# Patient Record
Sex: Female | Born: 1935 | Race: White | Hispanic: No
Health system: Southern US, Community
[De-identification: ages and names within clinical notes are randomized; demographics above are authoritative.]

---

## 2009-02-13 ENCOUNTER — Ambulatory Visit: Payer: Self-pay | Admitting: Anesthesiology

## 2009-02-21 ENCOUNTER — Ambulatory Visit: Payer: Self-pay | Admitting: Anesthesiology

## 2009-04-19 ENCOUNTER — Ambulatory Visit: Payer: Self-pay | Admitting: Anesthesiology

## 2009-05-10 ENCOUNTER — Ambulatory Visit: Payer: Self-pay | Admitting: Anesthesiology

## 2009-06-28 ENCOUNTER — Ambulatory Visit: Payer: Self-pay | Admitting: Anesthesiology

## 2009-07-19 ENCOUNTER — Ambulatory Visit: Payer: Self-pay | Admitting: Anesthesiology

## 2009-08-18 ENCOUNTER — Ambulatory Visit: Payer: Self-pay | Admitting: Anesthesiology

## 2012-02-29 ENCOUNTER — Inpatient Hospital Stay: Payer: Self-pay | Admitting: Specialist

## 2012-02-29 LAB — URINALYSIS, COMPLETE
Bilirubin,UR: NEGATIVE
Glucose,UR: NEGATIVE mg/dL (ref 0–75)
Leukocyte Esterase: NEGATIVE
Nitrite: POSITIVE
Protein: NEGATIVE
RBC,UR: 1 /HPF (ref 0–5)
WBC UR: 3 /HPF (ref 0–5)

## 2012-02-29 LAB — COMPREHENSIVE METABOLIC PANEL
Albumin: 3.4 g/dL (ref 3.4–5.0)
Anion Gap: 12 (ref 7–16)
BUN: 17 mg/dL (ref 7–18)
Calcium, Total: 8.7 mg/dL (ref 8.5–10.1)
Chloride: 105 mmol/L (ref 98–107)
EGFR (African American): >60
EGFR (Non-African Amer.): >60
Glucose: 171 mg/dL — ABNORMAL HIGH (ref 65–99)
Potassium: 3.8 mmol/L (ref 3.5–5.1)
SGPT (ALT): 19 U/L
Sodium: 140 mmol/L (ref 136–145)

## 2012-02-29 LAB — CBC
HCT: 42.3 % (ref 35.0–47.0)
HGB: 14 g/dL (ref 12.0–16.0)
MCHC: 33.2 g/dL (ref 32.0–36.0)
MCV: 98 fL (ref 80–100)
Platelet: 237 10*3/uL (ref 150–440)
RBC: 4.32 10*6/uL (ref 3.80–5.20)
RDW: 13 % (ref 11.5–14.5)

## 2012-02-29 LAB — PROTIME-INR
INR: 1.2
Prothrombin Time: 15.4 secs — ABNORMAL HIGH (ref 11.5–14.7)

## 2012-02-29 LAB — CK TOTAL AND CKMB (NOT AT ARMC): CK-MB: 8.1 ng/mL — ABNORMAL HIGH (ref 0.5–3.6)

## 2012-02-29 LAB — TROPONIN I: Troponin-I: <0.02 ng/mL

## 2012-03-01 LAB — CBC WITH DIFFERENTIAL/PLATELET
Basophil %: 0.2 %
Eosinophil %: 0 %
HCT: 37.5 % (ref 35.0–47.0)
HGB: 12.6 g/dL (ref 12.0–16.0)
Lymphocyte %: 7.5 %
MCV: 98 fL (ref 80–100)
Monocyte #: 1.5 x10 3/mm — ABNORMAL HIGH (ref 0.2–0.9)
Monocyte %: 10 %
Neutrophil #: 12 10*3/uL — ABNORMAL HIGH (ref 1.4–6.5)
Neutrophil %: 82.3 %
Platelet: 201 10*3/uL (ref 150–440)
RDW: 13.5 % (ref 11.5–14.5)

## 2012-03-01 LAB — BASIC METABOLIC PANEL
Anion Gap: 9 (ref 7–16)
BUN: 16 mg/dL (ref 7–18)
Calcium, Total: 8.3 mg/dL — ABNORMAL LOW (ref 8.5–10.1)
Chloride: 107 mmol/L (ref 98–107)
Creatinine: 0.59 mg/dL — ABNORMAL LOW (ref 0.60–1.30)
EGFR (Non-African Amer.): >60
Glucose: 136 mg/dL — ABNORMAL HIGH (ref 65–99)
Sodium: 144 mmol/L (ref 136–145)

## 2012-03-01 LAB — CK TOTAL AND CKMB (NOT AT ARMC)
CK, Total: 848 U/L — ABNORMAL HIGH (ref 21–215)
CK-MB: 10.5 ng/mL — ABNORMAL HIGH (ref 0.5–3.6)

## 2012-03-01 LAB — TROPONIN I: Troponin-I: <0.02 ng/mL

## 2012-03-02 LAB — BASIC METABOLIC PANEL
Anion Gap: 7 (ref 7–16)
BUN: 11 mg/dL (ref 7–18)
EGFR (African American): >60
Glucose: 92 mg/dL (ref 65–99)
Osmolality: 278 (ref 275–301)

## 2012-03-02 LAB — CBC WITH DIFFERENTIAL/PLATELET
Basophil #: 0 10*3/uL (ref 0.0–0.1)
Basophil %: 0.2 %
Eosinophil #: 0.1 10*3/uL (ref 0.0–0.7)
Eosinophil %: 0.8 %
HCT: 30.7 % — ABNORMAL LOW (ref 35.0–47.0)
HGB: 10.3 g/dL — ABNORMAL LOW (ref 12.0–16.0)
Lymphocyte %: 10.9 %
MCH: 33.1 pg (ref 26.0–34.0)
MCV: 98 fL (ref 80–100)
Monocyte #: 1.4 x10 3/mm — ABNORMAL HIGH (ref 0.2–0.9)
Monocyte %: 11.9 %
RBC: 3.13 10*6/uL — ABNORMAL LOW (ref 3.80–5.20)
RDW: 13.3 % (ref 11.5–14.5)

## 2012-03-03 LAB — CBC WITH DIFFERENTIAL/PLATELET
Eosinophil #: 0.1 10*3/uL (ref 0.0–0.7)
Eosinophil %: 1.4 %
Lymphocyte #: 1.5 10*3/uL (ref 1.0–3.6)
Lymphocyte %: 15.6 %
MCHC: 34 g/dL (ref 32.0–36.0)
MCV: 97 fL (ref 80–100)
Monocyte #: 1.1 x10 3/mm — ABNORMAL HIGH (ref 0.2–0.9)
Monocyte %: 10.9 %
Neutrophil #: 7 10*3/uL — ABNORMAL HIGH (ref 1.4–6.5)
Neutrophil %: 72 %
RBC: 2.94 10*6/uL — ABNORMAL LOW (ref 3.80–5.20)
RDW: 13.1 % (ref 11.5–14.5)

## 2012-03-03 LAB — COMPREHENSIVE METABOLIC PANEL
BUN: 8 mg/dL (ref 7–18)
Calcium, Total: 7.8 mg/dL — ABNORMAL LOW (ref 8.5–10.1)
Chloride: 105 mmol/L (ref 98–107)
Co2: 25 mmol/L (ref 21–32)
Glucose: 93 mg/dL (ref 65–99)
SGOT(AST): 37 U/L (ref 15–37)
SGPT (ALT): 23 U/L
Total Protein: 5.2 g/dL — ABNORMAL LOW (ref 6.4–8.2)

## 2012-03-07 LAB — URINE CULTURE

## 2012-03-13 ENCOUNTER — Emergency Department: Payer: Self-pay | Admitting: Emergency Medicine

## 2012-03-13 LAB — COMPREHENSIVE METABOLIC PANEL
Alkaline Phosphatase: 85 U/L (ref 50–136)
Anion Gap: 6 — ABNORMAL LOW (ref 7–16)
Bilirubin,Total: 0.7 mg/dL (ref 0.2–1.0)
Calcium, Total: 8.1 mg/dL — ABNORMAL LOW (ref 8.5–10.1)
Co2: 27 mmol/L (ref 21–32)
Creatinine: 0.54 mg/dL — ABNORMAL LOW (ref 0.60–1.30)
EGFR (Non-African Amer.): >60
Glucose: 92 mg/dL (ref 65–99)
Osmolality: 263 (ref 275–301)
Potassium: 4.5 mmol/L (ref 3.5–5.1)
SGOT(AST): 21 U/L (ref 15–37)
SGPT (ALT): 20 U/L
Sodium: 132 mmol/L — ABNORMAL LOW (ref 136–145)
Total Protein: 6.5 g/dL (ref 6.4–8.2)

## 2012-03-13 LAB — TROPONIN I: Troponin-I: <0.02 ng/mL

## 2012-03-13 LAB — CBC
MCH: 33.2 pg (ref 26.0–34.0)
MCHC: 33.7 g/dL (ref 32.0–36.0)
Platelet: 327 10*3/uL (ref 150–440)
RDW: 14.3 % (ref 11.5–14.5)
WBC: 10.6 10*3/uL (ref 3.6–11.0)

## 2012-03-13 LAB — URINALYSIS, COMPLETE
Bilirubin,UR: NEGATIVE
Glucose,UR: NEGATIVE mg/dL (ref 0–75)
Leukocyte Esterase: NEGATIVE
Ph: 7 (ref 4.5–8.0)
Protein: NEGATIVE
RBC,UR: <1 /HPF (ref 0–5)
Specific Gravity: 1.009 (ref 1.003–1.030)
WBC UR: 1 /HPF (ref 0–5)

## 2012-03-13 LAB — DRUG SCREEN, URINE
Cannabinoid 50 Ng, Ur ~~LOC~~: NEGATIVE (ref ?–50)
Cocaine Metabolite,Ur ~~LOC~~: NEGATIVE (ref ?–300)
Phencyclidine (PCP) Ur S: NEGATIVE (ref ?–25)
Tricyclic, Ur Screen: NEGATIVE (ref ?–1000)

## 2012-03-13 LAB — CK TOTAL AND CKMB (NOT AT ARMC)
CK, Total: 31 U/L (ref 21–215)
CK-MB: 0.7 ng/mL (ref 0.5–3.6)

## 2012-08-16 IMAGING — CR RIGHT HIP - COMPLETE 2+ VIEW
1 series · 3 of 3 positions shown · non-contrast
Comparison: none

REASON FOR EXAM: fall, pain - R hip
COMMENTS:

PROCEDURE:     DXR - DXR HIP RIGHT COMPLETE  - February 29, 2012  [DATE]
RESULT:     Comparison:  None

[Series 1: t hip ap right · 0.14mm/px · 3 of 3 slices shown]
[im 1/3]
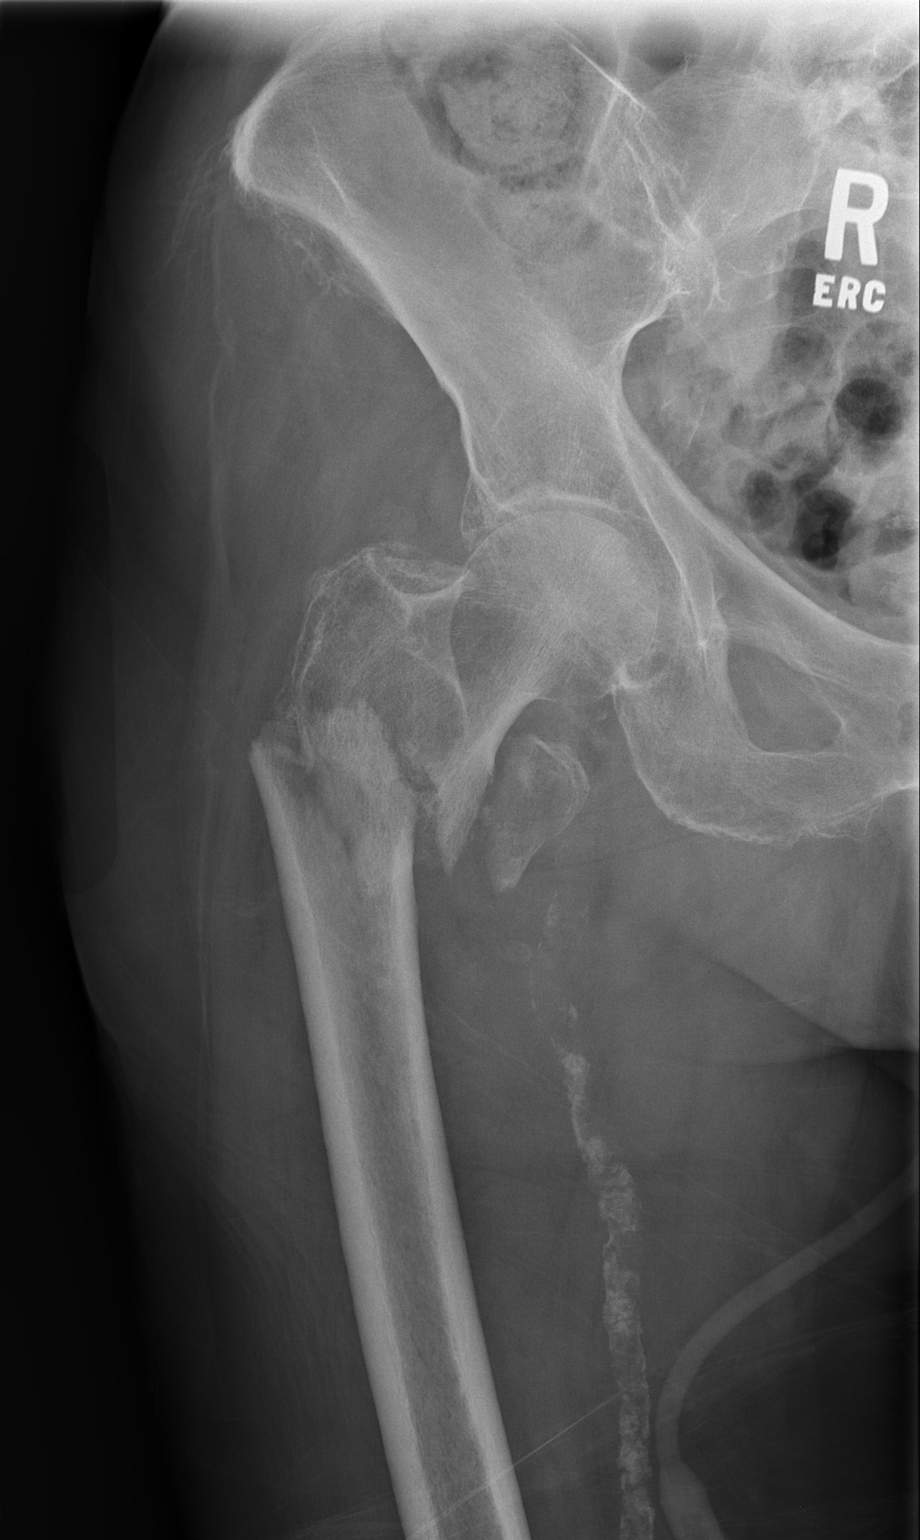
[im 2/3]
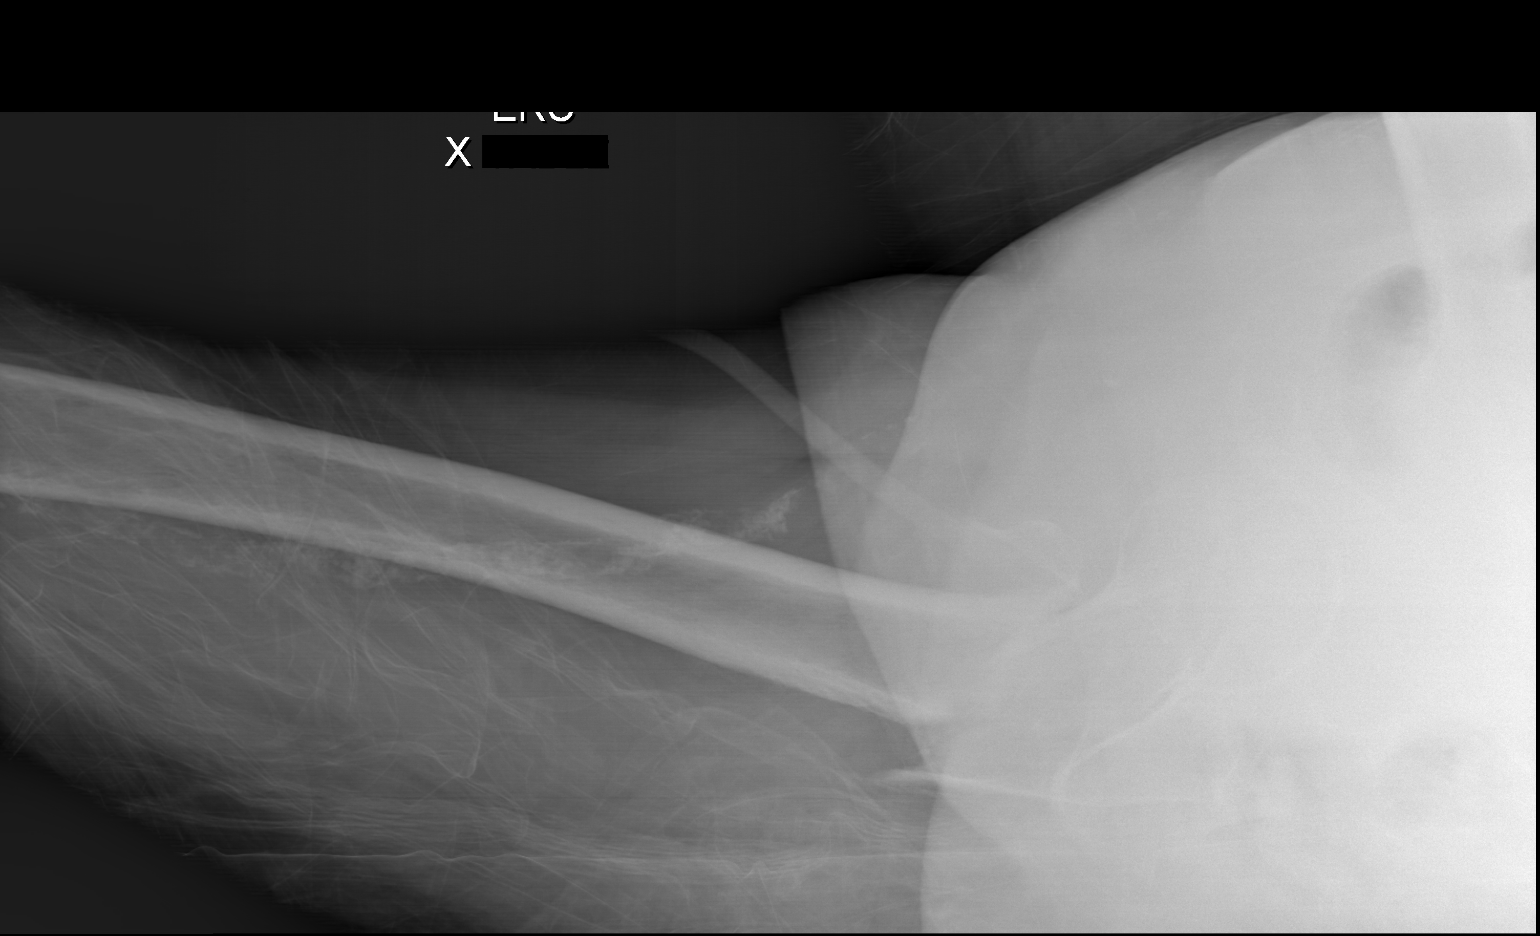
[im 3/3]
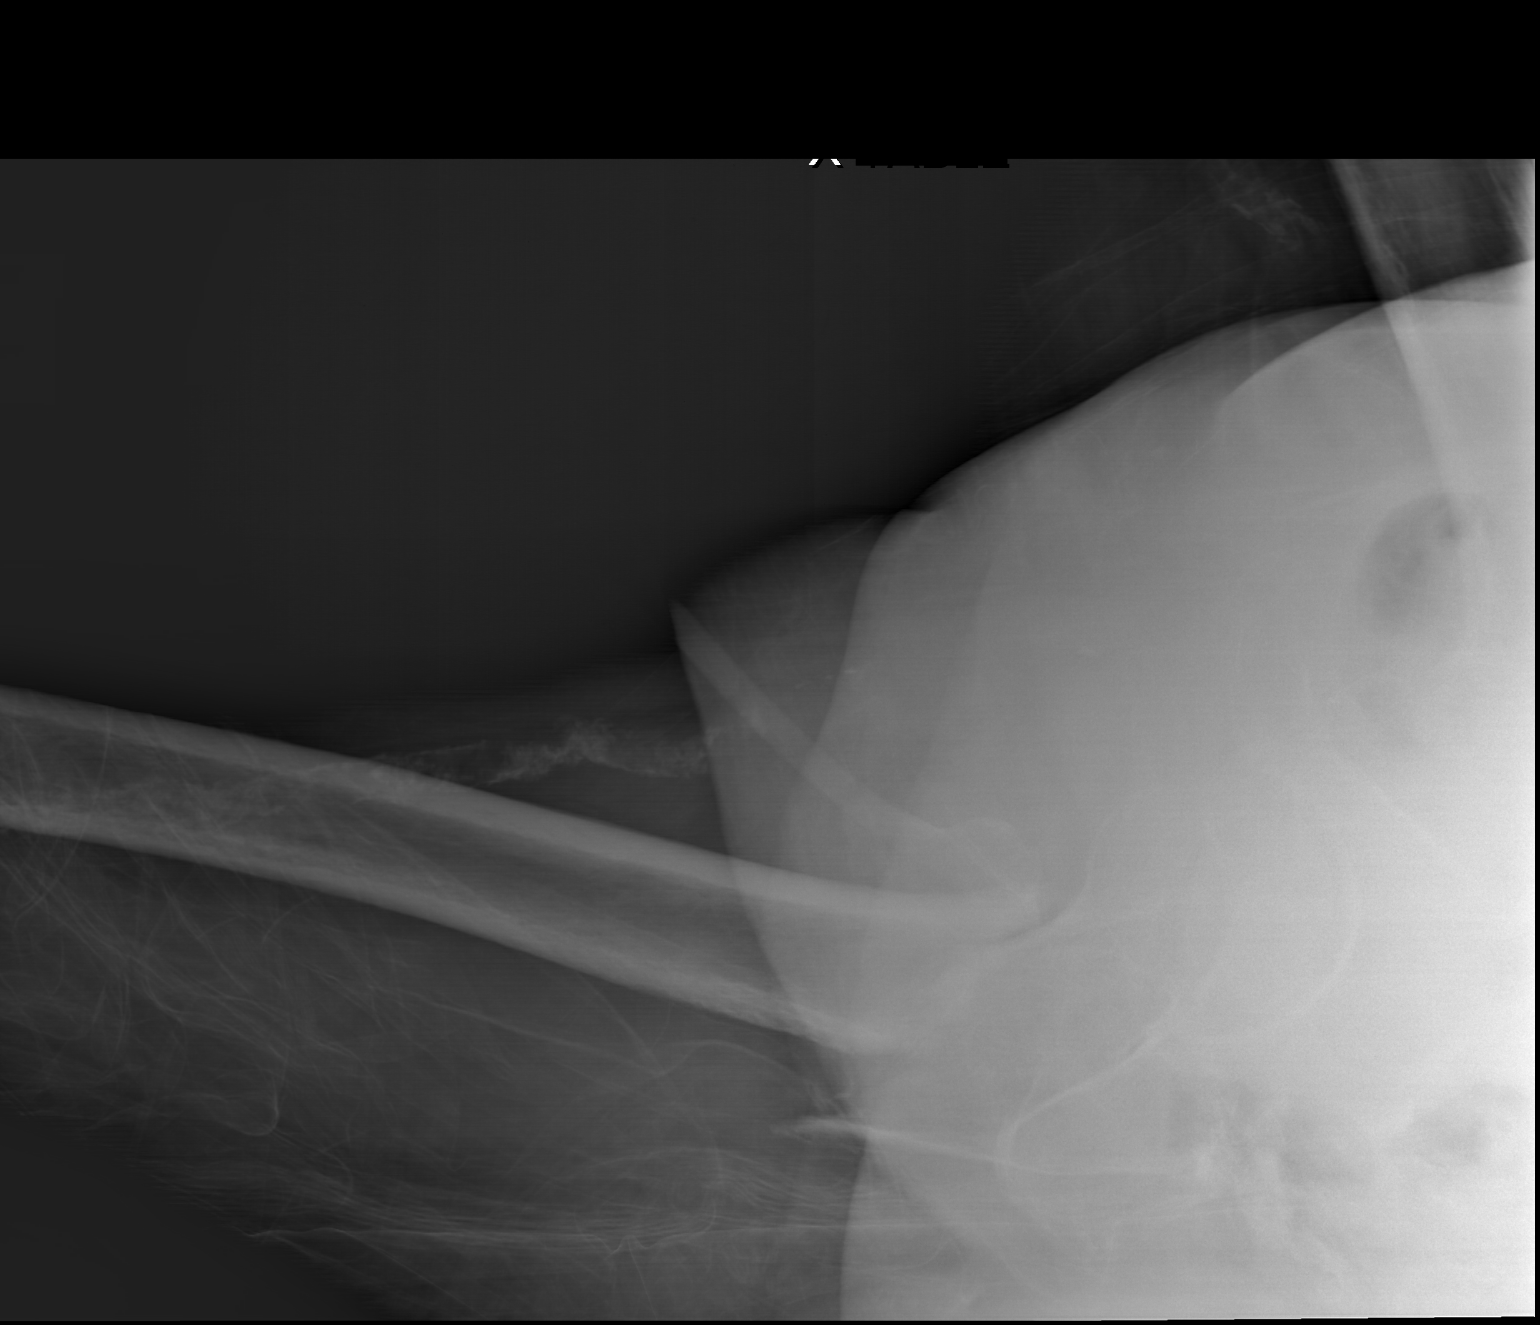

[3 of 3 positions shown; findings below may reference images not displayed]

FINDINGS: AP and lateral view of the right hip demonstrates comminuted and displaced
proximal right femoral diaphyseal fracture. The lesser trochanter is
displaced medially. There is no hip dislocation. There is severe osteopenia
IMPRESSION: Comminuted and displaced proximal right femoral diaphyseal fracture.

[REDACTED]

## 2012-08-16 IMAGING — CT CT CERVICAL SPINE WITHOUT CONTRAST
1 series · 12 of 14 positions shown, 15 images · non-contrast
Comparison: None

REASON FOR EXAM: fall, this am, neck pain
COMMENTS:

PROCEDURE:     CT  - CT CERVICAL SPINE WO  - February 29, 2012  [DATE]
RESULT:     Clinical Indication: Trauma
TECHNIQUE: Multiple axial CT images from the skull base to the mid vertebral
body of T1. obtained with sagittal and coronal reformatted images provided.

[Series 6: axial · axial · 0.33mm/px · z∈[+150,+286]mm · 12 of 83 slices shown, 15 images]
[im 7/83  soft-tissue]
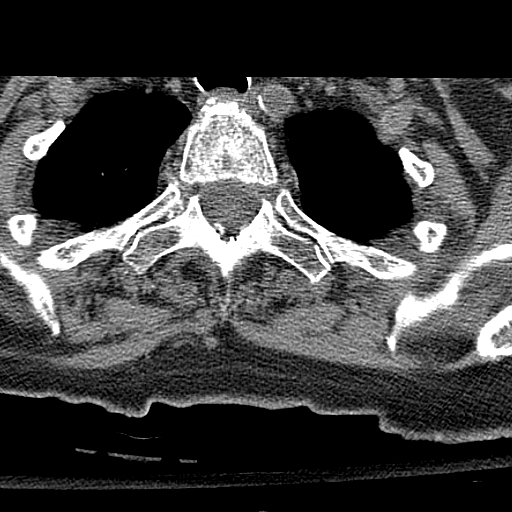
[im 7/83  bone]
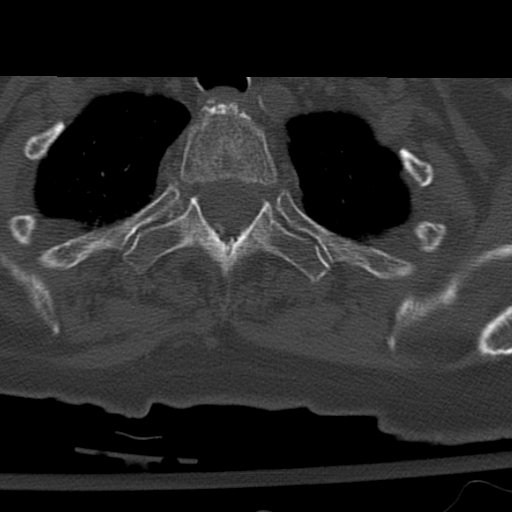
[im 13/83  bone]
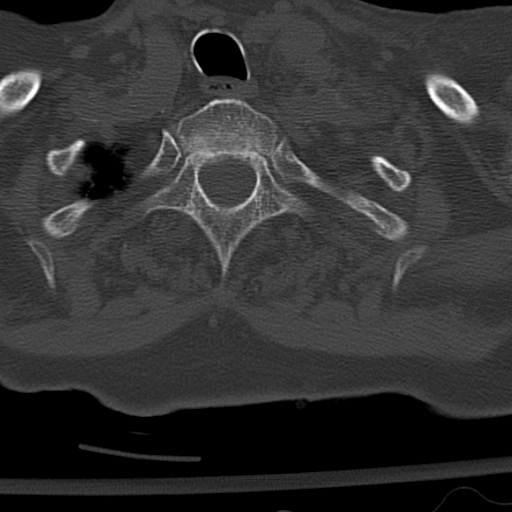
[im 19/83  bone]
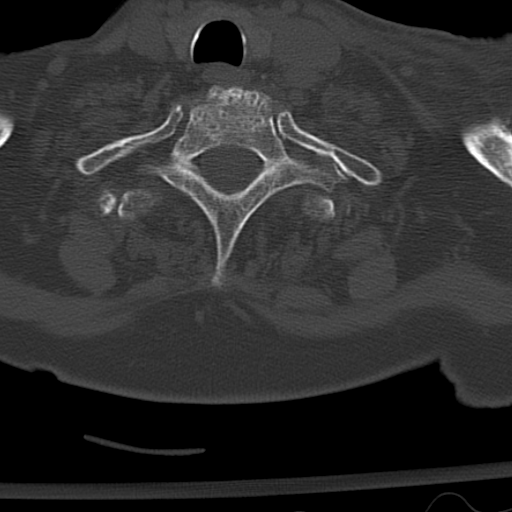
[im 26/83  bone]
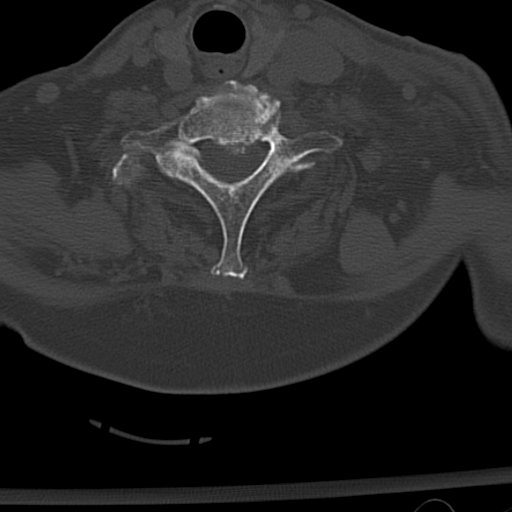
[im 32/83  soft-tissue]
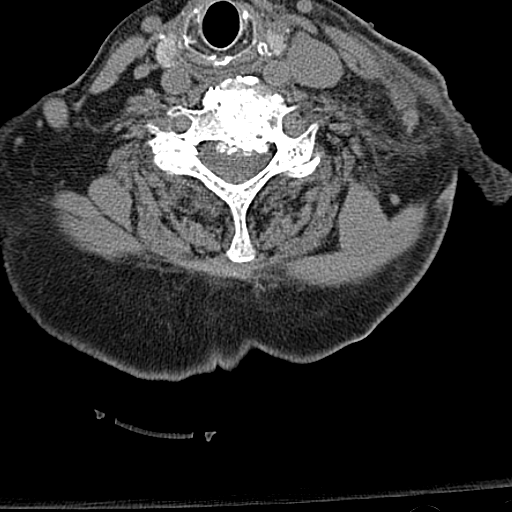
[im 32/83  bone]
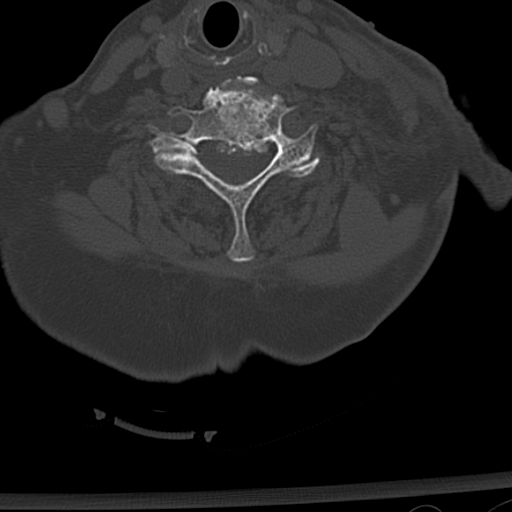
[im 38/83  bone]
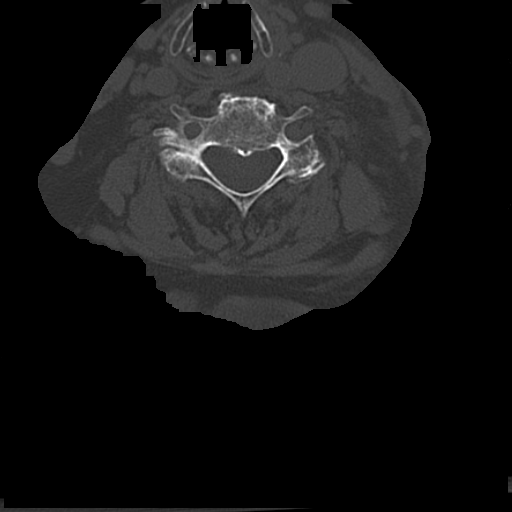
[im 45/83  bone]
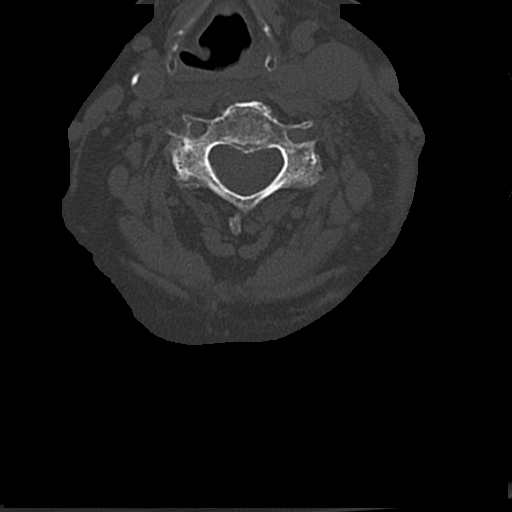
[im 51/83  bone]
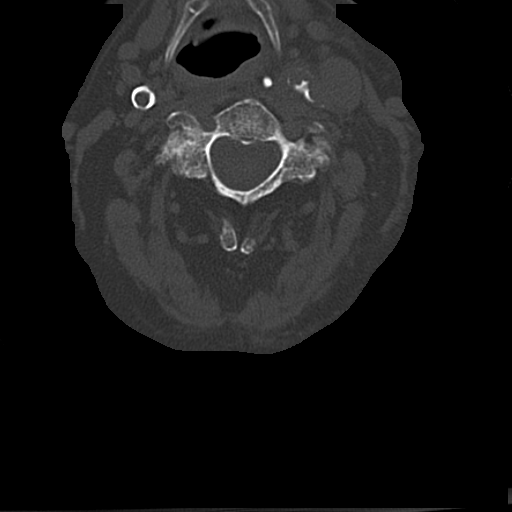
[im 57/83  soft-tissue]
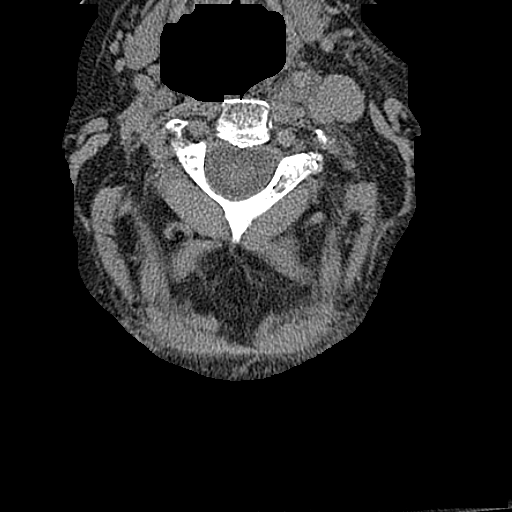
[im 57/83  bone]
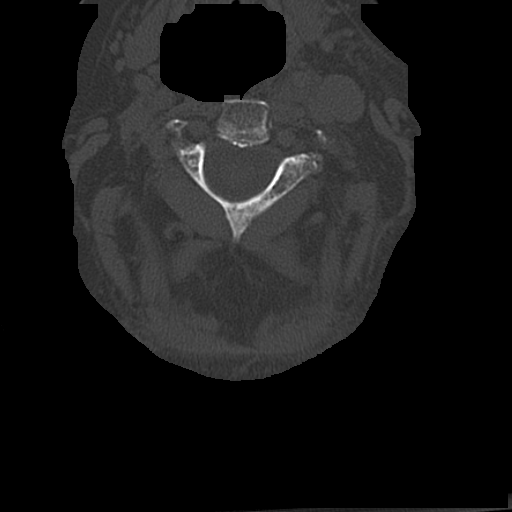
[im 64/83  bone]
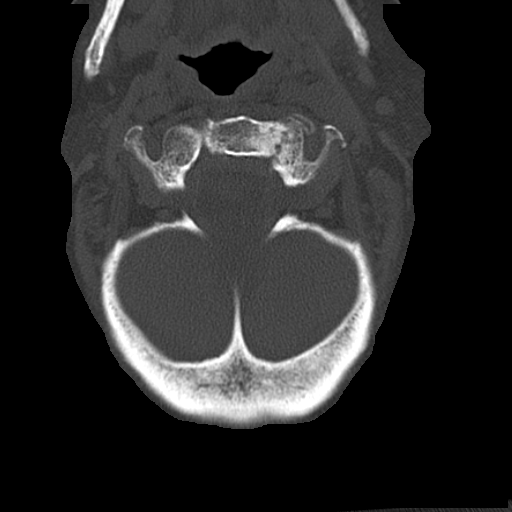
[im 70/83  bone]
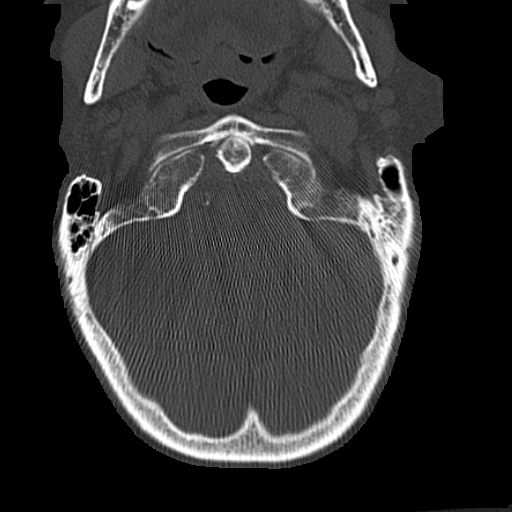
[im 76/83  bone]
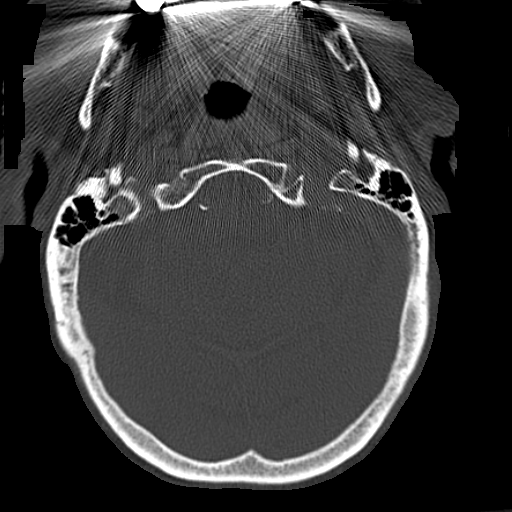

[12 of 14 positions shown; findings below may reference images not displayed]

FINDINGS: The alignment is anatomic. The vertebral body heights are maintained. There
is no acute fracture or static listhesis. The prevertebral soft tissues are
normal. The intraspinal soft tissues are not fully imaged on this
examination due to poor soft tissue contrast, but there is no soft tissue
gross abnormality. There is edema along the left lower cervical region
within the subcutaneous fat likely from direct injury.

There is degenerative disc disease throughout the cervical spine most
significant at C4-C5, C5-C6 and C6-C7. There is 1-2 mm anterolisthesis of C3
on C4 secondary to degenerative facet arthropathy.

The visualized portions of the lung apices demonstrate no focal abnormality.
IMPRESSION: 1. No acute osseous injury of the cervical spine.

2. Ligamentous injury is not evaluated. If there is high clinical concern
for ligamentous injury, consider MRI or flexion/extension radiographs as
clinically indicated and tolerated.

[REDACTED]

## 2012-08-19 IMAGING — CR DG SHOULDER 3+V*R*
1 series · 3 of 3 positions shown · non-contrast
Comparison: none

REASON FOR EXAM: pain
COMMENTS:

[Series 9: x shoulder ap left · 0.14mm/px · 3 of 3 slices shown]
[im 1/3]
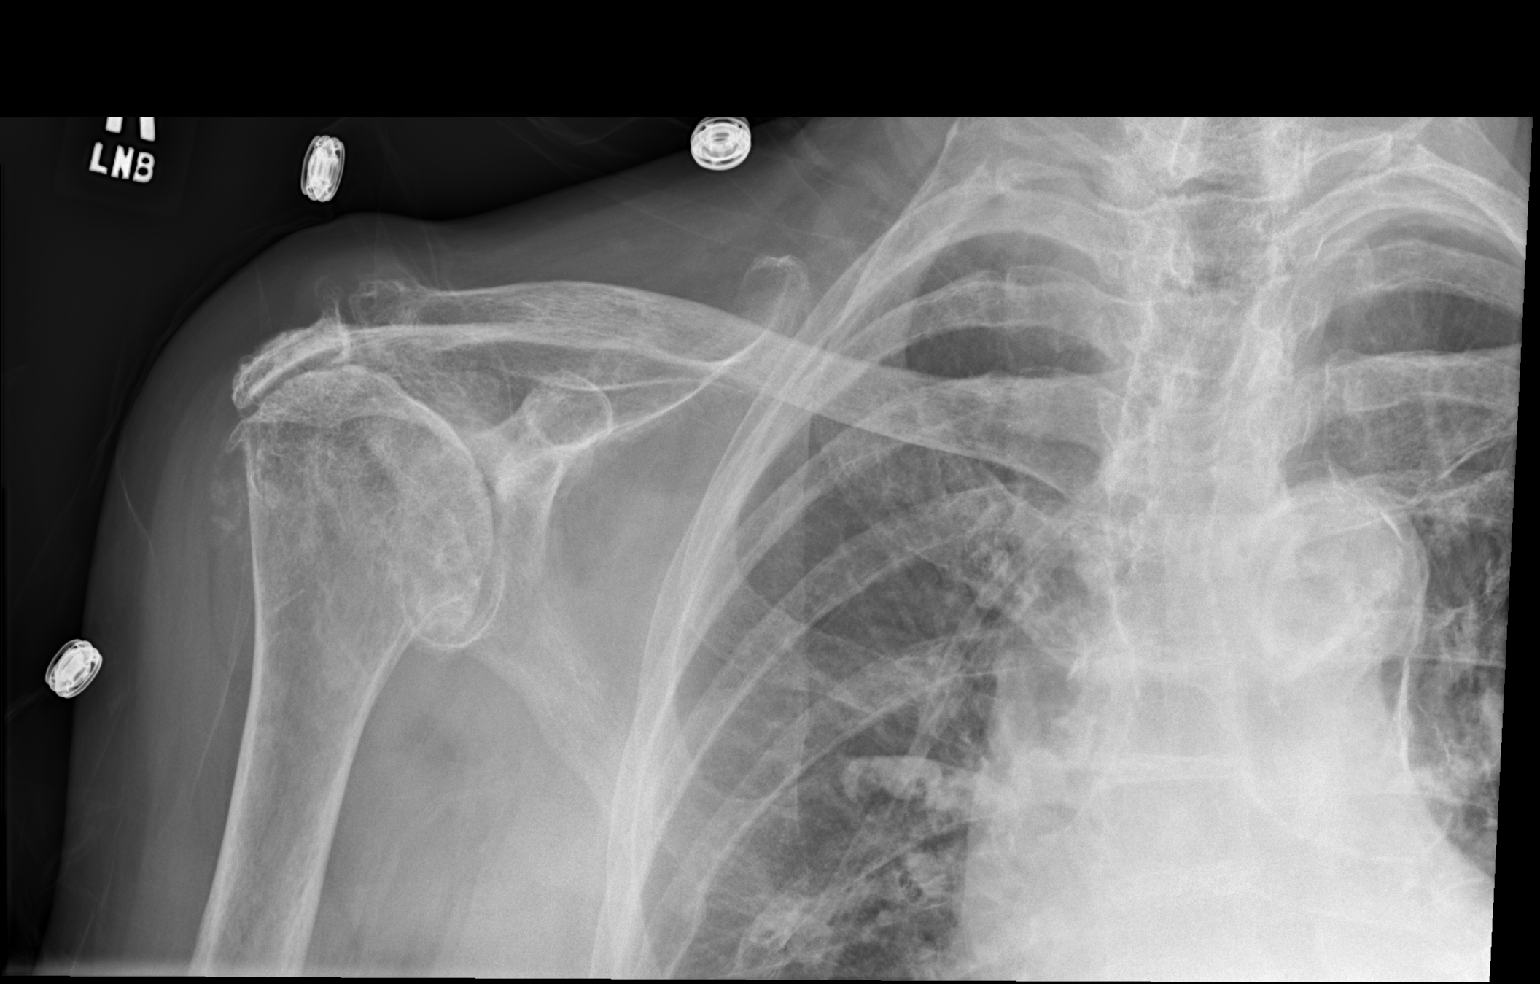
[im 2/3]
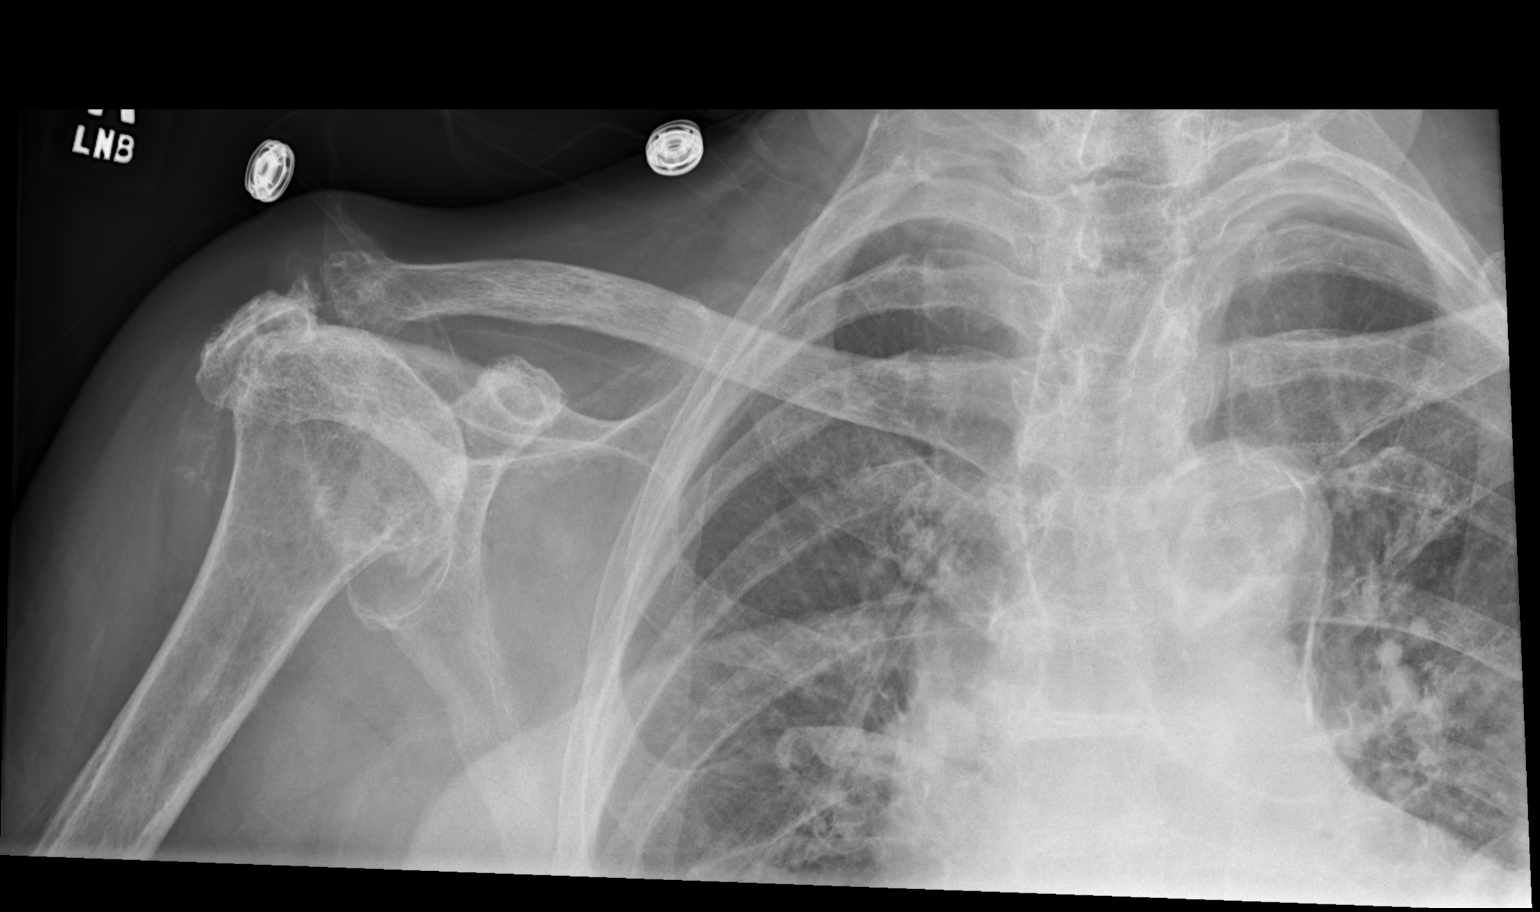
[im 3/3]
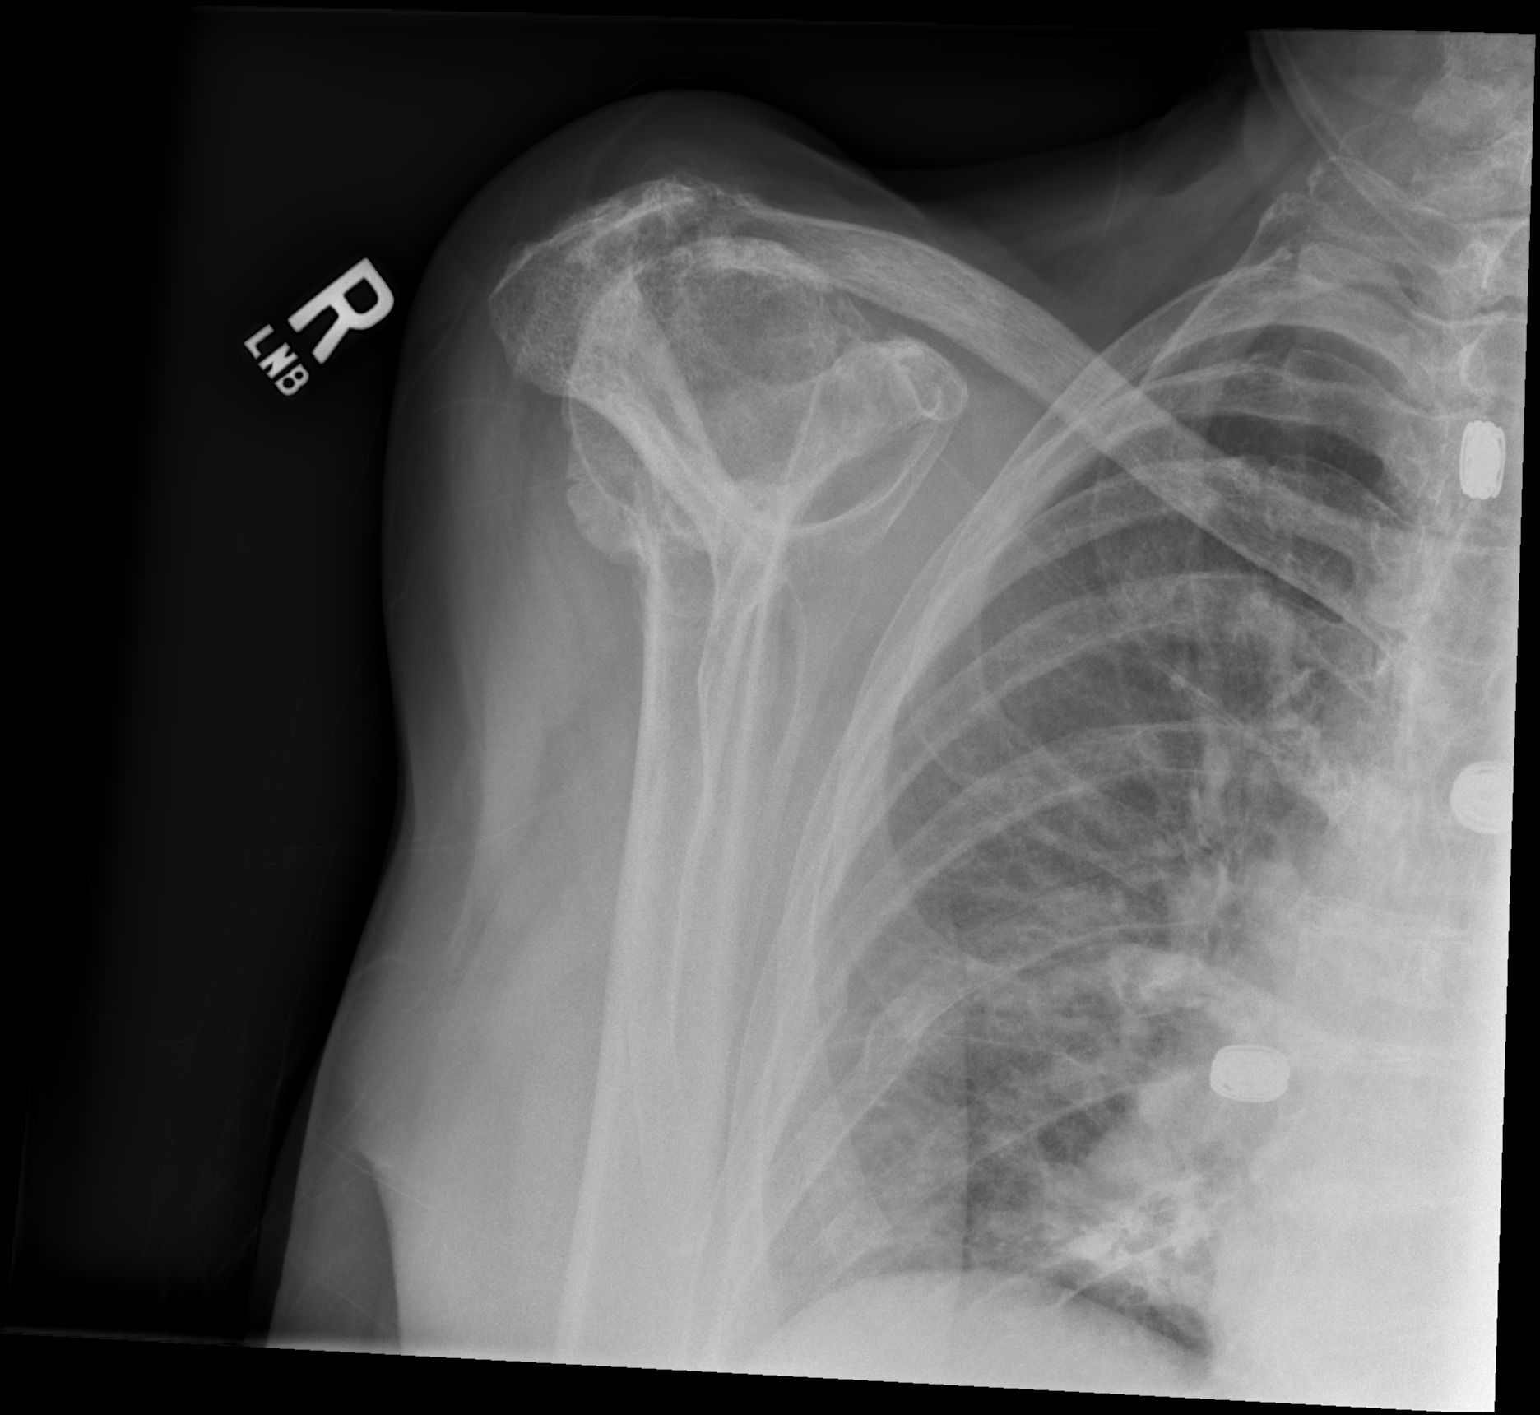

[3 of 3 positions shown; findings below may reference images not displayed]

PROCEDURE:     DXR - DXR SHOULDER RIGHT COMPLETE  - March 03, 2012  [DATE]

RESULT:     No fracture or dislocation is seen. There is arthritic change at
the right shoulder. The space between the acromion and humeral head is
nearly obliterated. Soft tissue calcification is observed lateral to the
humeral head. Arthritic spur formation is noted at the acromioclavicular
joint.
IMPRESSION: 1. No acute fracture or dislocation is seen.
2. Arthritic changes are observed as mentioned above.
3. There is soft tissue calcification lateral to the humeral head.

## 2014-02-24 DIAGNOSIS — I10 Essential (primary) hypertension: Secondary | ICD-10-CM | POA: Insufficient documentation

## 2014-02-24 DIAGNOSIS — E785 Hyperlipidemia, unspecified: Secondary | ICD-10-CM | POA: Insufficient documentation

## 2015-02-26 NOTE — Op Note (Signed)
PATIENT NAME:  Alyssa Clark, Alyssa Clark MR#:  [id number] DATE OF BIRTH:  November 07, 1916  DATE OF PROCEDURE:  03/01/2012  PREOPERATIVE DIAGNOSIS: Comminuted displaced pertrochanteric fracture right hip.   POSTOPERATIVE DIAGNOSIS: Comminuted displaced pertrochanteric fracture right hip.   PROCEDURE PERFORMED: Open reduction and internal fixation right hip with Synthes trochanteric fixation nail device (130 degrees 11 mm rod, 115 mm spiral blade, 40 mm distal screw).   SURGEON: Park Breed, MD  ANESTHESIA: Spinal.   COMPLICATIONS: None.   DRAINS: None.   ESTIMATED BLOOD LOSS: 200 mL.  REPLACEMENT: None.   DESCRIPTION OF PROCEDURE: The patient was brought to the Operating Room where she underwent satisfactory spinal anesthesia, was placed on the fracture table in the supine position. The left leg was flexed and abducted and the right leg was placed in gentle traction and internally rotated slightly. Leg was adducted somewhat and traction applied. Fluoroscopy showed good positioning of the fracture fragments in AP and lateral view. The hip was prepped and draped in sterile fashion and a short longitudinal incision made just above the greater trochanter. Dissection was carried out sharply through subcutaneous tissue and fascia. The tip of the trochanter was palpated and a guidepin inserted. 18 mm drill was used to open the tip of the trochanter fully. The guidepin was then advanced distally. It was bent to fit the curve of the canal and was passed across the fracture site in good position on both AP and lateral views. A 130 degrees x 11 mm TFN rod was then inserted over the guidepin and passed distally. The guidepin was removed. The rod with the outrigger attached was advanced distally and rotated posteriorly some since the head was quite anterior. The second stab wound was made distally and guide device inserted down to bone. A guidepin was inserted under fluoroscopic control. Adjustments were made in the  rotation and depth of the rod until pin was advanced into the center of the head on AP and lateral views. The distal aiming device was tightened fully. The lateral cortex was opened. Reamer was introduced into the neck and head almost up to the tip of the pin. This was removed. The pin had been put into a depth of 125 mm. I selected a 115 mm spiral blade and advance this fully into good position in the head on both AP and lateral views. Traction was released. The set screw was advanced and tightened and then backed off slightly. The alignment was good. The fracture site was compressed as much as possible. The distal aiming device was inserted and a hole drilled for the distal screw hole. 40 mm screw was inserted and seated snugly. Outriggers were removed. Fracture device appeared to be in excellent position, the fracture was in good position. The wounds were irrigated and closed with 0 Vicryl on fascia and subcutaneous tissue. Skin was closed with staples. 0.25% Marcaine with epinephrine was placed in both wounds. A dry sterile dressing was applied. The patient was taken out of traction and transferred to her hospital bed. The hip had good rotation and alignment. She was taken to recovery room in good condition.  ____________________________ Park Breed, MD hem:cms D: 03/01/2012 16:59:24 ET T: 03/02/2012 10:43:28 ET JOB#: [id number]  cc: Park Breed, MD, <Dictator> Park Breed MD ELECTRONICALLY SIGNED 03/02/2012 11:38

## 2015-02-26 NOTE — Discharge Summary (Signed)
PATIENT NAME:  Alyssa Clark, Alyssa Clark MR#:  [id number] DATE OF BIRTH:  10/18/1917  DATE OF ADMISSION:  02/29/2012 DATE OF DISCHARGE:  03/04/2012  FINAL DIAGNOSES:  1. Displaced pertrochanteric fracture, right hip.  2. Hypertension.  3. Arthritis. 4. Osteoporosis.  5. Hyperlipidemia.  6. Macular degeneration.  7. Chronic insomnia.  8. Mild rhabdomyolysis. 9. Preadmission urinary tract infection.  OPERATIONS: On 03/01/2012 open reduction and internal fixation of right hip with a Synthes trochanteric fixation nail.  HOME MEDICATIONS: 1. Mobic 15 mg daily. 2. Ambien 10 mg at bedtime. 3. Norvasc 10 mg daily.  4. Avapro 150 mg daily.   DISCHARGE MEDICATIONS:  1. Norco 5/325 mg p.r.n. every 6 hours p.r.n. pain.  2. Enteric-coated aspirin 1 p.o. twice a day.  3. Iron 325 mg daily.  4. Stools softer and laxatives as needed.   COMPLICATIONS: None.   CONSULTATIONS:  1. Fulton Reek, MD. 2. [facility]   HISTORY: The patient is a 81 year old female who lives in independent living at [facility] who fell on the morning of admission injuring her right leg. She was not found for several hours and was brought to the emergency room where exam and x-rays revealed a comminuted displaced pertrochanteric fracture of the right hip. She was admitted for medical evaluation and surgery. She was hydrated overnight. The patient was cleared for surgery. I discussed the options with the patient and her family. They elected to proceed with surgical fixation of the fracture. The risks and benefits and postoperative protocols were discussed with them.  PAST MEDICAL HISTORY: - Illnesses - as above.   MEDICATIONS: As above.   ALLERGIES: Sulfa, penicillin, gentamicin, and yellow dye #5.   SOCIAL HISTORY: The patient does not smoke or drink.   FAMILY HISTORY: Positive for hypertension and coronary artery disease.   REVIEW OF SYSTEMS: Unremarkable otherwise.   PHYSICAL EXAMINATION: On admission the patient was  alert and cooperative. Vital signs were normal. She had shortening and external rotation of the right hip. There was pain with motion of the hip. Neurovascular status was good distally. There was pain and some minimal swelling of the right foot.   LABORATORY DATA: Data on admission was satisfactory.   HOSPITAL COURSE: On 03/01/2012, the patient underwent surgery for fixation of the hip with a Synthes trochanteric fixation nail. Postoperatively she did well overall. She complained of bilateral shoulder pain with attempts at using a walker. X-rays revealed chronic degenerative changes in both shoulders consistent with cuff arthropathy disease. She also complain more of the right foot, which was x-rayed. No obvious fractures were noted, but there was a possibility of a hairline fracture at the base of the fifth metatarsal. She was provided a metatarsal shoe for ambulation. The hemoglobin dropped somewhat and was 9.7 on the second postoperative day.      She was not symptomatic from this. By 03/04/2012, the patient was stable and ready for skilled nursing discharge. Rehabilitation potential is fair. She will be seen in my office in two weeks for exam and x-ray. She should be partial weight-bearing on the right leg with a walker. ____________________________ Park Breed, MD hem:slb D: 03/04/2012 14:40:08 ET T: 03/04/2012 15:08:41 ET JOB#: [id number]  cc: Park Breed, MD, <Dictator> Rusty Aus, MD Park Breed MD ELECTRONICALLY SIGNED 03/05/2012 12:25

## 2015-02-26 NOTE — Consult Note (Signed)
Chief Complaint:   Subjective/Chief Complaint Pt with hx of HTN now with right hip fx. Laid on floor for several hrs. CPK, WBC, and glucose elevated. Denies CP. No cardiac hx.   Brief Assessment:   Cardiac Regular    Respiratory clear BS    Gastrointestinal details normal Soft  Nontender  Bowel sounds normal   Cardiac:  27-Apr-13 19:29    Troponin I < 0.02   CK, Total 768   CPK-MB, Serum 8.1  Routine Hem:  27-Apr-13 19:29    WBC (CBC) 17.7   Hemoglobin (CBC) 14.0   Platelet Count (CBC) 237  Routine Chem:  27-Apr-13 19:29    Glucose, Serum 171   BUN 17   Creatinine (comp) 0.76   Sodium, Serum 140   Potassium, Serum 3.8   Chloride, Serum 105   CO2, Serum 23   Calcium (Total), Serum 8.7   eGFR (Non-African American) >60  Routine UA:  27-Apr-13 19:29    Nitrite (UA) Positive   Leukocyte Esterase (UA) Negative   WBC (UA) 3 /HPF   Bacteria (UA) 2+   Radiology Results: XRay:    27-Apr-13 21:14, Chest Portable Single View   Chest Portable Single View    REASON FOR EXAM:    fall, prolonged down time on floor (12 hrs), pain, hip  COMMENTS:       PROCEDURE: DXR - DXR PORTABLE CHEST SINGLE VIEW  - Feb 29 2012  9:14PM     RESULT: Comparison: None    Findings:     Single portable AP chest radiograph is provided. There is bilateral   interstitial thickening likely chronic. There is a bandlike area appears   disease in the right infrahilar region may represent a fibrosis, but   underlying pathology cannot be excluded. Recommend further evaluation   withnonemergent CT of the chest.. There is no other focal parenchymal   opacity, pleural effusion, or pneumothorax. Normal cardiomediastinal     silhouette. There is severe degenerative joint disease of the right   glenohumeral joint. unremarkable.    IMPRESSION:     Please see above.    Dictation Site: 3          Verified By: Jennette Banker, M.D., MD  CT:    27-Apr-13 20:11, CT Head Without Contrast   CT Head  Without Contrast    REASON FOR EXAM:    fall, this am, unclear if she hit her head, lay on   floor for 12 hours  COMMENTS:       PROCEDURE: CT  - CT HEAD WITHOUT CONTRAST  - Feb 29 2012  8:11PM     RESULT: Comparison:  None    Technique: Multiple axial images from the foramen magnum to the vertex   were obtained without IV contrast.    Findings:      There is no evidence of mass effect, midline shift, or extra-axial fluid   collections.  There is no evidence of a space-occupying lesion or     intracranial hemorrhage. There is no evidence of a cortical-based area of   acute infarction. There is generalized cerebral atrophy. There is   periventricular white matter low attenuation likely secondary to   microangiopathy.    The ventricles and sulci are appropriate for the patient's age. The basal   cisterns are patent.    Visualized portions of the orbits are unremarkable. The visualized   portions of the paranasal sinuses and mastoid air cells are unremarkable.   Cerebrovascular  atherosclerotic calcifications are noted.    The osseous structures are unremarkable.    IMPRESSION:    No acute intracranial process.        Dictation Site: 3          Verified By: Jennette Banker, M.D., MD   Assessment/Plan:  Assessment/Plan:   Plan Pt medically stable and cleared for ortho. surgery. Will add empiric lopressor. Send urine culture. PO cipro for possible UTI. Hydrate for mild rhabdo. Follow sugars. Repeat labs in AM.   Electronic Signatures: Idelle Crouch (MD)  (Signed 28-Apr-13 00:21)  Authored: Chief Complaint, Brief Assessment, Lab Results, Radiology Results, Assessment/Plan   Last Updated: 28-Apr-13 00:21 by Idelle Crouch (MD)

## 2015-02-26 NOTE — Consult Note (Signed)
PATIENT NAME:  Alyssa Clark, Alyssa Clark MR#:  [id number] DATE OF BIRTH:  January 05, 1917  DATE OF CONSULTATION:  03/01/2012  REFERRING PHYSICIAN:  Earnestine Leys, MD CONSULTING PHYSICIAN:  Leonie Douglas. Doy Hutching, MD  FAMILY PHYSICIAN: Dr. Emily Filbert.   REASON FOR CONSULTATION: Preop medical clearance in a 81 year old patient with hip fracture.   HISTORY OF PRESENT ILLNESS: The patient is a 81 year old female followed by Dr. Sabra Heck with a history of osteoporosis, osteoarthritis, and benign hypertension. The patient presents to the Emergency Room after falling earlier this morning injuring her right hip. She lay on the floor for several hours before presenting to the Emergency Room where she was found to have a right hip fracture. In the Emergency Room, the patient's CPK and MB were mildly elevated consistent with rhabdomyolysis. She has been having some urinary urgency. 2+ bacteria was noted in her urine. She was also noted to have an elevated white count and was hyperglycemic. She states that she tripped and fell earlier today. Denies presyncope or syncope. No palpitations. No dizziness. No chest pain or shortness of breath. No previous cardiac history.   PAST MEDICAL HISTORY:  1. Osteoarthritis.  2. Osteoporosis.  3. Benign hypertension.  4. Hyperlipidemia.  5. History of cataract surgery.  6. Macular degeneration.  7. Status post hysterectomy.  8. Chronic insomnia.   MEDICATIONS:  1. Mobic 15 mg p.o. daily.  2. Ambien 10 mg p.o. at bedtime.  3. Norvasc 10 mg p.o. daily.  4. Avapro 150 mg p.o. daily.   ALLERGIES: Sulfa, penicillin, gentamicin, and yellow dye #5.   SOCIAL HISTORY: Negative for alcohol or tobacco abuse.   FAMILY HISTORY: Positive for hypertension and coronary artery disease.   REVIEW OF SYSTEMS: CONSTITUTIONAL: No fever or change in weight. EYES: No blurred or double vision. No glaucoma. ENT: No tinnitus or hearing loss. No nasal discharge or bleeding. No difficulty swallowing.  RESPIRATORY: No cough or wheezing. Denies hemoptysis or painful respirations. CARDIOVASCULAR: No chest pain or orthopnea. No palpitations or syncope. GASTROINTESTINAL: No nausea, vomiting, or diarrhea. No abdominal pain. No change in bowel habits. GU: No dysuria or hematuria. No incontinence. ENDOCRINE: No polyuria or polydipsia. No heat or cold intolerance. HEMATOLOGIC: The patient denies anemia, easy bruising, or bleeding. LYMPHATIC: No swollen glands. MUSCULOSKELETAL: The patient denies pain in her neck, shoulders, although she does have pain in her back, hip, and knees. No gout. NEUROLOGIC: No numbness or weakness. No migraines or stroke or seizures. PSYCH: The patient denies anxiety or depression, although she does have problems with insomnia.   PHYSICAL EXAMINATION:  GENERAL: The patient is elderly, in no acute distress.   VITAL SIGNS: Currently remarkable for a blood pressure of 129/72 with a heart rate of 93 and a respiratory rate of 20. She is afebrile.   HEENT: Normocephalic, atraumatic. Pupils equally round and reactive to light and accommodation. Extraocular movements are intact. Sclerae anicteric. Conjunctivae are clear. Oropharynx is clear.   NECK: Supple without jugular venous distention or bruits. No adenopathy or thyromegaly was noted.   LUNGS: Clear to auscultation and percussion without wheezes, rales, or rhonchi. No dullness.   CARDIAC: Regular rate and rhythm. Normal S1 and S2. No significant rubs, murmurs, or gallops. PMI is nondisplaced. Chest wall is nontender.   ABDOMEN: Soft, nontender, normoactive bowel sounds. No organomegaly or masses were appreciated. No hernias or bruits were noted.   EXTREMITIES: Without clubbing, cyanosis, or edema. Pulses were 2+ bilaterally.   SKIN: Warm and dry without rash or lesions.  NEUROLOGIC: Cranial nerves II through XII grossly intact. Deep tendon reflexes were symmetric. Motor and sensory examination is nonfocal.   PSYCH: The  patient was alert and oriented to person, place, and time. She was cooperative and used good judgment.   LABORATORY, DIAGNOSTIC AND RADIOLOGICAL DATA: CBC revealed a white count of 17.7 with a hemoglobin of 14.0. Glucose was 171 with a BUN of 17 and a creatinine of 0.76 with a sodium of 140 and a potassium of 3.8. Her GFR was greater than 60. Troponin was less than 0.02. Total CK was 768 with an MB of 8.1. Urinalysis showed 2+ bacteria with 3 WBCs per high-power field and was nitrite positive. Chest x-ray was unremarkable. EKG revealed sinus tachycardia with no acute ischemic changes.   ASSESSMENT:  1. Right hip fracture.  2. Benign hypertension.  3. Hyperglycemia.  4. Leukocytosis.  5. Mild rhabdomyolysis with dehydration.   PLAN:  1. The patient appears to be medically stable and is cleared for orthopedic surgery.  2. Will add empiric perioperative beta blockers at this time.  3. We will send off a urine culture and place the patient on p.o. Cipro for presumed urinary tract infection.  4. We will continue her outpatient blood pressure regimen.  5. We will hydrate with normal saline at this time and follow serial cardiac enzymes.  6. We will follow her sugars with Accu-Cheks before meals and at bedtime and add sliding scale insulin as needed.  7. Routine labs daily.      Thank you for the consultation. Please call if questions arise. We will continue to follow this patient with you while in the hospital.    ____________________________ Leonie Douglas. Doy Hutching, MD jds:ap D: 03/01/2012 00:27:19 ET T: 03/01/2012 10:47:01 ET JOB#: [id number]  cc: Leonie Douglas. Doy Hutching, MD, <Dictator> Rusty Aus, MD Janvi Ammar D Giulian Goldring MD ELECTRONICALLY SIGNED 03/01/2012 20:15

## 2015-02-26 NOTE — Consult Note (Signed)
Chief Complaint:   Subjective/Chief Complaint Pt with some confusion. Denies pain or SOB.   Brief Assessment:   Cardiac Regular    Respiratory clear BS    Gastrointestinal details normal Soft  Nontender  Bowel sounds normal   Cardiac:  27-Apr-13 19:29    Troponin I < 0.02   CK, Total 768   CPK-MB, Serum 8.1  28-Apr-13 04:13    Troponin I < 0.02   CK, Total 957   CPK-MB, Serum 10.5   Assessment/Plan:  Assessment/Plan:   Plan No change in care. Surgery pending . Will follow. Labs in AM.   Electronic Signatures: Idelle Crouch (MD)  (Signed 28-Apr-13 07:27)  Authored: Chief Complaint, Brief Assessment, Lab Results, Assessment/Plan   Last Updated: 28-Apr-13 07:27 by Idelle Crouch (MD)

## 2015-02-26 NOTE — H&P (Signed)
Subjective/Chief Complaint Pain right hip    History of Present Illness 81 year old fell at [facility] independent living yesterday am and was not found for several hours.  Brought to Emergency Room where exam and X-rays show a displaced pertrochanteric fracture right hip.  Admitted for medical evaluation and surgery. Dr Doy Hutching has seen and cleared her for surgery.  Family is present.  Have discussed options with them and patient at length and they wish to proceed with surgery.  Risks and benefits of surgery were discussed at length including but not limited to infection, non union, nerve or blood vessed damage, non union, need for repeat surgery, blood clots and lung emboli, and death.  Plan TFN repair of fracture.  she is very alert and oriented and in generally good health for her age.   Past Med/Surgical Hx:  osteoporosis:   arthritis:   HTN:   Eye Surgery - Right: cataracts, and macular degeneration  Eye Surgery - Left: cataract  Hysterectomy - Total:   Tonsillectomy and Adenoidectomy:   ALLERGIES:  Sulfa drugs: Swelling  PCN: Unknown  Gentamicin: Unknown  Yellow Dye #5: Unknown  HOME MEDICATIONS: Medication Instructions Status  amlodipine 10 mg oral tablet 1 tab(s) po once a day  Active  Avapro 150 mg oral tablet 1 tab(s) po once a day  Active  Mobic 15 mg oral tablet 1 tab(s) orally once a day Active  Ambien 5 mg oral tablet 2 tab(s) orally once a day (at bedtime) Active   Family and Social History:   Family History Non-Contributory    Social History negative tobacco, negative ETOH    Place of Living Home  Alone   Review of Systems:   Fever/Chills No    Cough No    Sputum No    Abdominal Pain No    Diarrhea No   Physical Exam:   GEN well developed, well nourished, no acute distress    HEENT pink conjunctivae    NECK supple    RESP normal resp effort    CARD regular rate    ABD denies tenderness    GU foley catheter in place    EXTR negative  edema, Right leg short and rotated.  circulation/sensation/motor function good distally.  Pain with range of motion.  Skin intact.    SKIN normal to palpation    NEURO motor/sensory function intact   Cardiac:  27-Apr-13 19:29    Troponin I < 0.02   CK, Total 768   CPK-MB, Serum 8.1  Routine Hem:  27-Apr-13 19:29    WBC (CBC) 17.7   RBC (CBC) 4.32   Hemoglobin (CBC) 14.0   Hematocrit (CBC) 42.3   Platelet Count (CBC) 237   MCV 98   MCH 32.5   MCHC 33.2   RDW 13.0  Routine Chem:  27-Apr-13 19:29    Glucose, Serum 171   BUN 17   Creatinine (comp) 0.76   Sodium, Serum 140   Potassium, Serum 3.8   Chloride, Serum 105   CO2, Serum 23   Calcium (Total), Serum 8.7  Hepatic:  27-Apr-13 19:29    Bilirubin, Total 1.0   Alkaline Phosphatase 59   SGPT (ALT) 19   SGOT (AST) 45   Total Protein, Serum 7.0   Albumin, Serum 3.4  Routine Chem:  27-Apr-13 19:29    Osmolality (calc) 285   eGFR (African American) >60   eGFR (Non-African American) >60   Anion Gap 12  Routine UA:  27-Apr-13 19:29    Color (UA) Yellow   Clarity (UA) Cloudy   Glucose (UA) Negative   Bilirubin (UA) Negative   Ketones (UA) 1+   Specific Gravity (UA) 1.018   Blood (UA) 1+   pH (UA) 6.0   Protein (UA) Negative   Nitrite (UA) Positive   Leukocyte Esterase (UA) Negative   RBC (UA) 1 /HPF   WBC (UA) 3 /HPF   Bacteria (UA) 2+   Mucous (UA) PRESENT  Routine BB:  27-Apr-13 19:29    Crossmatch Unit 1 Ready   Crossmatch Unit 2 Ready  Routine Coag:  27-Apr-13 19:29    Prothrombin 15.4   INR 1.2  Routine BB:  27-Apr-13 19:29    Antibody Screen NEGATIVE  Cardiac:  28-Apr-13 04:13    Troponin I < 0.02   CK, Total 957   CPK-MB, Serum 10.5  Routine Hem:  28-Apr-13 04:13    WBC (CBC) 14.6   RBC (CBC) 3.82   Hemoglobin (CBC) 12.6   Hematocrit (CBC) 37.5   Platelet Count (CBC) 201   MCV 98   MCH 33.0   MCHC 33.6   RDW 13.5  Routine Chem:  28-Apr-13 04:13    Glucose, Serum 136   BUN 16    Creatinine (comp) 0.59   Sodium, Serum 144   Potassium, Serum 3.7   Chloride, Serum 107   CO2, Serum 28   Calcium (Total), Serum 8.3   Osmolality (calc) 290   eGFR (African American) >60   eGFR (Non-African American) >60   Anion Gap 9  Routine Hem:  28-Apr-13 04:13    Neutrophil % 82.3   Lymphocyte % 7.5   Monocyte % 10.0   Eosinophil % 0.0   Basophil % 0.2   Neutrophil # 12.0   Lymphocyte # 1.1   Monocyte # 1.5   Eosinophil # 0.0   Basophil # 0.0  Blood Glucose:  28-Apr-13 07:21    POCT Blood Glucose 126    11:13    POCT Blood Glucose 106  Cardiac:  28-Apr-13 11:24    Troponin I < 0.02   CK, Total 848   CPK-MB, Serum 8.7   Radiology Results: XRay:    27-Apr-13 21:14, Hip Right Complete   Hip Right Complete   REASON FOR EXAM:    fall, pain - R hip  COMMENTS:       PROCEDURE: DXR - DXR HIP RIGHT COMPLETE  - Feb 29 2012  9:14PM     RESULT: Comparison:  None    Findings:    AP and lateral view of the right hip demonstrates comminuted and   displaced proximalright femoral diaphyseal fracture. The lesser   trochanter is displaced medially. There is no hip dislocation. There is   severe osteopenia    IMPRESSION:   Comminuted and displaced proximal right femoral diaphyseal fracture.     Dictation Site: 3          Verified By: Jennette Banker, M.D., MD    27-Apr-13 21:14, Pelvis AP Only   Pelvis AP Only   REASON FOR EXAM:    fall,  hip pain  COMMENTS:       PROCEDURE: DXR - DXR PELVIS AP ONLY  - Feb 29 2012  9:14PM     RESULT: History: Fall    Comparison: None    Findings:    AP pelvis demonstrates comminuted and displaced proximal right femoral   diaphyseal fracture. The was a trochanter or that is  displaced medially.   There is no hip dislocation. There is severe osteopenia    IMPRESSION:     Comminuted and displaced proximal right femoral diaphyseal fracture.    Dictation Site: 3          Verified By: Jennette Banker, M.D., MD      Assessment/Admission Diagnosis Displaced right pertrochanteric hip fracture    Plan ORIF right hip with IM nail   Electronic Signatures: Park Breed (MD)  (Signed 28-Apr-13 13:21)  Authored: CHIEF COMPLAINT and HISTORY, PAST MEDICAL/SURGIAL HISTORY, ALLERGIES, HOME MEDICATIONS, FAMILY AND SOCIAL HISTORY, REVIEW OF SYSTEMS, PHYSICAL EXAM, LABS, Radiology, ASSESSMENT AND PLAN   Last Updated: 28-Apr-13 13:21 by Park Breed (MD)

## 2015-02-26 NOTE — Consult Note (Signed)
PATIENT NAME:  Alyssa Clark, LEAVENS MR#:  [id number] DATE OF BIRTH:  01/05/1917  DATE OF CONSULTATION:  03/03/2012  REFERRING PHYSICIAN:   CONSULTING PHYSICIAN:  Gerrit Heck. Ailie Gage, DPM  REASON FOR CONSULTATION: Injury right foot secondary to falling on Sunday at time when she also broke her right hip.   HISTORY OF PRESENT ILLNESS: She is 81 years old, fell at [facility] independent living and was not found for several hours on Sunday. She is brought into the ED. Dr. Earnestine Leys did surgery on her to correct her hip fracture. Dr. Doy Hutching has been following her medically in the hospital as well.   PAST MEDICAL HISTORY:  1. Arthritis. 2. Hypertension. 3. Bilateral eye surgery. 4. Hysterectomy. 5. Tonsillectomy, adenoidectomy. 6. Osteoporosis.   ALLERGIES: Sulfa, penicillin, gentamicin and yellow dye #5.   CURRENT MEDICATIONS AT HOME:  1. Amlodipine. 2. Avapro. 3. Mobic. 4. Ambien.  FAMILY HISTORY: Noncontributory.   SOCIAL HISTORY: Denies EtOH. Denies smoking. Lives in independent living at [facility].   REVIEW OF SYSTEMS: She denies any chest pain or trouble breathing. No wheezing or respiratory issues.   PHYSICAL EXAMINATION: VITAL SIGNS: Her vital signs include: Temperature 98.5, pulse 73, respirations 20, blood pressure 106/73, pulse oximetry 93%.   LOWER EXTREMITY EXAM: Vasculature: DP pulses are palpable bilaterally. Orthopedic: Patient has ecchymosis to the base of the third and fourth toes on the right foot, some contracture of those toes as well. It is not terribly painful to palpation on those regions but some ecchymotic changes are noted. More pronounced ecchymosis over the lateral CC joint area and fourth and fifth metatarsal cuboid area. There is tenderness to palpation around this region as well. No pain in the posterior heel. No ecchymosis or evidence of swelling or injury to that region.   LABORATORY, DIAGNOSTIC AND RADIOLOGICAL DATA: X-rays reviewed. Three views taken  in the hospital; AP, lateral and oblique show evidence of what appears to be some old metatarsal fractures three and four on the right foot as well as possible old fracture to the fifth metatarsal base. Also noted when I reviewed these is there is what appears to be an old partial avulsion of the Achilles tendon that likely pulled some calcaneal exostosis with it went it avulsed. She does not have a history or recollection of any previous falls or injuries to the foot that she is aware of. Evaluation of the posterior Achilles region shows that the tendon is intact in the area although there is calcification prior to its crossing the posterior calcaneal margin. She can function plantar flex her foot with ease and there is no bruising or ecchymosis in the area consistent with an acute rupture.   CLINICAL IMPRESSION AND RECOMMENDATIONS: I think the patient likely sprained the lateral right foot. The extensor digitorum brevis muscle belly may have been injured. Possible sprain of some of the ligamentous structures in that region as well. I think the lesser MTP joints are fairly stable. I will recommend just a postoperative shoe for her at this point since she has the fractured hip and don't want to put too much weight on that side once she starts to ambulate and follow her up again in a couple of weeks to make sure there is no occult fracture that were not able to visualize on the original x-rays.   ____________________________ Gerrit Heck Tsuneo Faison, DPM mgt:cms D: 03/03/2012 13:27:41 ET T: 03/03/2012 14:01:31 ET JOB#: [id number]  cc: Gerrit Heck Jackob Crookston, DPM, <Dictator>  Camren Henthorn G Jaycee Pelzer  MD ELECTRONICALLY SIGNED 03/23/2012 10:40

## 2017-10-02 DIAGNOSIS — I10 Essential (primary) hypertension: Secondary | ICD-10-CM | POA: Diagnosis not present

## 2017-10-02 DIAGNOSIS — G301 Alzheimer's disease with late onset: Secondary | ICD-10-CM

## 2017-10-02 DIAGNOSIS — F39 Unspecified mood [affective] disorder: Secondary | ICD-10-CM

## 2017-10-02 DIAGNOSIS — K219 Gastro-esophageal reflux disease without esophagitis: Secondary | ICD-10-CM

## 2017-10-02 DIAGNOSIS — E785 Hyperlipidemia, unspecified: Secondary | ICD-10-CM

## 2017-10-02 DIAGNOSIS — M159 Polyosteoarthritis, unspecified: Secondary | ICD-10-CM

## 2017-10-08 DIAGNOSIS — B351 Tinea unguium: Secondary | ICD-10-CM

## 2017-10-16 DIAGNOSIS — K219 Gastro-esophageal reflux disease without esophagitis: Secondary | ICD-10-CM

## 2017-10-16 DIAGNOSIS — I10 Essential (primary) hypertension: Secondary | ICD-10-CM

## 2017-10-16 DIAGNOSIS — G301 Alzheimer's disease with late onset: Secondary | ICD-10-CM

## 2017-10-16 DIAGNOSIS — M159 Polyosteoarthritis, unspecified: Secondary | ICD-10-CM

## 2017-10-16 DIAGNOSIS — F39 Unspecified mood [affective] disorder: Secondary | ICD-10-CM

## 2017-12-26 DIAGNOSIS — I10 Essential (primary) hypertension: Secondary | ICD-10-CM | POA: Diagnosis not present

## 2017-12-26 DIAGNOSIS — M1990 Unspecified osteoarthritis, unspecified site: Secondary | ICD-10-CM

## 2017-12-26 DIAGNOSIS — K219 Gastro-esophageal reflux disease without esophagitis: Secondary | ICD-10-CM

## 2017-12-26 DIAGNOSIS — F39 Unspecified mood [affective] disorder: Secondary | ICD-10-CM

## 2017-12-26 DIAGNOSIS — G309 Alzheimer's disease, unspecified: Secondary | ICD-10-CM

## 2018-02-12 DIAGNOSIS — I10 Essential (primary) hypertension: Secondary | ICD-10-CM | POA: Diagnosis not present

## 2018-02-12 DIAGNOSIS — K219 Gastro-esophageal reflux disease without esophagitis: Secondary | ICD-10-CM | POA: Diagnosis not present

## 2018-02-12 DIAGNOSIS — G301 Alzheimer's disease with late onset: Secondary | ICD-10-CM | POA: Diagnosis not present

## 2018-02-12 DIAGNOSIS — M159 Polyosteoarthritis, unspecified: Secondary | ICD-10-CM | POA: Diagnosis not present

## 2018-02-12 DIAGNOSIS — F39 Unspecified mood [affective] disorder: Secondary | ICD-10-CM | POA: Diagnosis not present

## 2018-03-04 DIAGNOSIS — M546 Pain in thoracic spine: Secondary | ICD-10-CM | POA: Diagnosis not present

## 2018-04-29 DIAGNOSIS — G309 Alzheimer's disease, unspecified: Secondary | ICD-10-CM | POA: Diagnosis not present

## 2018-04-29 DIAGNOSIS — K219 Gastro-esophageal reflux disease without esophagitis: Secondary | ICD-10-CM | POA: Diagnosis not present

## 2018-04-29 DIAGNOSIS — F39 Unspecified mood [affective] disorder: Secondary | ICD-10-CM | POA: Diagnosis not present

## 2018-04-29 DIAGNOSIS — I10 Essential (primary) hypertension: Secondary | ICD-10-CM | POA: Diagnosis not present

## 2018-04-29 DIAGNOSIS — M1990 Unspecified osteoarthritis, unspecified site: Secondary | ICD-10-CM | POA: Diagnosis not present

## 2018-07-02 DIAGNOSIS — F39 Unspecified mood [affective] disorder: Secondary | ICD-10-CM

## 2018-07-02 DIAGNOSIS — M159 Polyosteoarthritis, unspecified: Secondary | ICD-10-CM

## 2018-07-02 DIAGNOSIS — K219 Gastro-esophageal reflux disease without esophagitis: Secondary | ICD-10-CM

## 2018-07-02 DIAGNOSIS — G301 Alzheimer's disease with late onset: Secondary | ICD-10-CM | POA: Diagnosis not present

## 2018-07-02 DIAGNOSIS — I10 Essential (primary) hypertension: Secondary | ICD-10-CM

## 2018-07-22 DIAGNOSIS — D2360 Other benign neoplasm of skin of unspecified upper limb, including shoulder: Secondary | ICD-10-CM | POA: Diagnosis not present

## 2018-08-21 DIAGNOSIS — G309 Alzheimer's disease, unspecified: Secondary | ICD-10-CM | POA: Diagnosis not present

## 2018-08-21 DIAGNOSIS — M1990 Unspecified osteoarthritis, unspecified site: Secondary | ICD-10-CM

## 2018-08-21 DIAGNOSIS — K219 Gastro-esophageal reflux disease without esophagitis: Secondary | ICD-10-CM

## 2018-08-21 DIAGNOSIS — J8410 Pulmonary fibrosis, unspecified: Secondary | ICD-10-CM

## 2018-08-21 DIAGNOSIS — I10 Essential (primary) hypertension: Secondary | ICD-10-CM

## 2018-08-21 DIAGNOSIS — F39 Unspecified mood [affective] disorder: Secondary | ICD-10-CM

## 2018-08-27 DIAGNOSIS — L03114 Cellulitis of left upper limb: Secondary | ICD-10-CM

## 2018-10-07 DIAGNOSIS — R05 Cough: Secondary | ICD-10-CM | POA: Diagnosis not present

## 2018-10-15 DIAGNOSIS — L03031 Cellulitis of right toe: Secondary | ICD-10-CM | POA: Diagnosis not present

## 2018-10-26 DIAGNOSIS — G301 Alzheimer's disease with late onset: Secondary | ICD-10-CM

## 2018-10-26 DIAGNOSIS — F39 Unspecified mood [affective] disorder: Secondary | ICD-10-CM | POA: Diagnosis not present

## 2018-10-26 DIAGNOSIS — M159 Polyosteoarthritis, unspecified: Secondary | ICD-10-CM | POA: Diagnosis not present

## 2018-12-18 DIAGNOSIS — G309 Alzheimer's disease, unspecified: Secondary | ICD-10-CM

## 2018-12-18 DIAGNOSIS — F39 Unspecified mood [affective] disorder: Secondary | ICD-10-CM

## 2018-12-18 DIAGNOSIS — M1990 Unspecified osteoarthritis, unspecified site: Secondary | ICD-10-CM | POA: Diagnosis not present

## 2019-02-22 DIAGNOSIS — H6120 Impacted cerumen, unspecified ear: Secondary | ICD-10-CM

## 2019-03-01 DIAGNOSIS — M159 Polyosteoarthritis, unspecified: Secondary | ICD-10-CM

## 2019-03-01 DIAGNOSIS — G301 Alzheimer's disease with late onset: Secondary | ICD-10-CM

## 2019-03-01 DIAGNOSIS — F39 Unspecified mood [affective] disorder: Secondary | ICD-10-CM | POA: Diagnosis not present

## 2019-04-28 DIAGNOSIS — M1990 Unspecified osteoarthritis, unspecified site: Secondary | ICD-10-CM | POA: Diagnosis not present

## 2019-04-28 DIAGNOSIS — G3183 Dementia with Lewy bodies: Secondary | ICD-10-CM | POA: Diagnosis not present

## 2019-04-28 DIAGNOSIS — F39 Unspecified mood [affective] disorder: Secondary | ICD-10-CM | POA: Diagnosis not present

## 2019-06-29 DIAGNOSIS — F22 Delusional disorders: Secondary | ICD-10-CM | POA: Diagnosis not present

## 2019-06-29 DIAGNOSIS — M159 Polyosteoarthritis, unspecified: Secondary | ICD-10-CM | POA: Diagnosis not present

## 2019-06-29 DIAGNOSIS — G301 Alzheimer's disease with late onset: Secondary | ICD-10-CM

## 2019-06-29 DIAGNOSIS — F39 Unspecified mood [affective] disorder: Secondary | ICD-10-CM | POA: Diagnosis not present

## 2019-08-23 DIAGNOSIS — F333 Major depressive disorder, recurrent, severe with psychotic symptoms: Secondary | ICD-10-CM

## 2019-09-01 DIAGNOSIS — F22 Delusional disorders: Secondary | ICD-10-CM

## 2019-09-01 DIAGNOSIS — G309 Alzheimer's disease, unspecified: Secondary | ICD-10-CM

## 2019-09-01 DIAGNOSIS — F39 Unspecified mood [affective] disorder: Secondary | ICD-10-CM

## 2019-09-01 DIAGNOSIS — M1990 Unspecified osteoarthritis, unspecified site: Secondary | ICD-10-CM

## 2019-10-26 DIAGNOSIS — F39 Unspecified mood [affective] disorder: Secondary | ICD-10-CM | POA: Diagnosis not present

## 2019-10-26 DIAGNOSIS — G301 Alzheimer's disease with late onset: Secondary | ICD-10-CM | POA: Diagnosis not present

## 2019-10-26 DIAGNOSIS — M159 Polyosteoarthritis, unspecified: Secondary | ICD-10-CM | POA: Diagnosis not present

## 2019-10-26 DIAGNOSIS — F22 Delusional disorders: Secondary | ICD-10-CM | POA: Diagnosis not present

## 2019-12-17 DIAGNOSIS — F22 Delusional disorders: Secondary | ICD-10-CM

## 2019-12-17 DIAGNOSIS — M1990 Unspecified osteoarthritis, unspecified site: Secondary | ICD-10-CM | POA: Diagnosis not present

## 2019-12-17 DIAGNOSIS — F39 Unspecified mood [affective] disorder: Secondary | ICD-10-CM

## 2019-12-17 DIAGNOSIS — G309 Alzheimer's disease, unspecified: Secondary | ICD-10-CM

## 2020-02-29 DIAGNOSIS — M159 Polyosteoarthritis, unspecified: Secondary | ICD-10-CM | POA: Diagnosis not present

## 2020-02-29 DIAGNOSIS — F39 Unspecified mood [affective] disorder: Secondary | ICD-10-CM

## 2020-02-29 DIAGNOSIS — F22 Delusional disorders: Secondary | ICD-10-CM

## 2020-02-29 DIAGNOSIS — G301 Alzheimer's disease with late onset: Secondary | ICD-10-CM | POA: Diagnosis not present

## 2020-04-21 DIAGNOSIS — F39 Unspecified mood [affective] disorder: Secondary | ICD-10-CM | POA: Diagnosis not present

## 2020-04-21 DIAGNOSIS — F22 Delusional disorders: Secondary | ICD-10-CM

## 2020-04-21 DIAGNOSIS — G309 Alzheimer's disease, unspecified: Secondary | ICD-10-CM | POA: Diagnosis not present

## 2020-04-21 DIAGNOSIS — M1990 Unspecified osteoarthritis, unspecified site: Secondary | ICD-10-CM

## 2020-06-19 DIAGNOSIS — F22 Delusional disorders: Secondary | ICD-10-CM

## 2020-06-19 DIAGNOSIS — M159 Polyosteoarthritis, unspecified: Secondary | ICD-10-CM | POA: Diagnosis not present

## 2020-06-19 DIAGNOSIS — F39 Unspecified mood [affective] disorder: Secondary | ICD-10-CM | POA: Diagnosis not present

## 2020-06-19 DIAGNOSIS — G301 Alzheimer's disease with late onset: Secondary | ICD-10-CM

## 2020-08-25 DIAGNOSIS — F22 Delusional disorders: Secondary | ICD-10-CM

## 2020-08-25 DIAGNOSIS — F39 Unspecified mood [affective] disorder: Secondary | ICD-10-CM | POA: Diagnosis not present

## 2020-08-25 DIAGNOSIS — M1990 Unspecified osteoarthritis, unspecified site: Secondary | ICD-10-CM | POA: Diagnosis not present

## 2020-08-25 DIAGNOSIS — G309 Alzheimer's disease, unspecified: Secondary | ICD-10-CM

## 2020-09-08 DIAGNOSIS — B351 Tinea unguium: Secondary | ICD-10-CM | POA: Diagnosis not present

## 2020-10-02 DIAGNOSIS — F4320 Adjustment disorder, unspecified: Secondary | ICD-10-CM

## 2020-10-17 DIAGNOSIS — F22 Delusional disorders: Secondary | ICD-10-CM | POA: Diagnosis not present

## 2020-10-17 DIAGNOSIS — F39 Unspecified mood [affective] disorder: Secondary | ICD-10-CM | POA: Diagnosis not present

## 2020-10-17 DIAGNOSIS — G301 Alzheimer's disease with late onset: Secondary | ICD-10-CM | POA: Diagnosis not present

## 2020-10-17 DIAGNOSIS — M159 Polyosteoarthritis, unspecified: Secondary | ICD-10-CM | POA: Diagnosis not present

## 2020-11-14 DIAGNOSIS — E441 Mild protein-calorie malnutrition: Secondary | ICD-10-CM | POA: Diagnosis not present

## 2020-11-14 DIAGNOSIS — F0150 Vascular dementia without behavioral disturbance: Secondary | ICD-10-CM | POA: Diagnosis not present

## 2020-12-12 DIAGNOSIS — G301 Alzheimer's disease with late onset: Secondary | ICD-10-CM | POA: Diagnosis not present

## 2020-12-12 DIAGNOSIS — E441 Mild protein-calorie malnutrition: Secondary | ICD-10-CM | POA: Diagnosis not present

## 2020-12-27 DIAGNOSIS — F39 Unspecified mood [affective] disorder: Secondary | ICD-10-CM | POA: Diagnosis not present

## 2020-12-27 DIAGNOSIS — G309 Alzheimer's disease, unspecified: Secondary | ICD-10-CM | POA: Diagnosis not present

## 2020-12-27 DIAGNOSIS — E43 Unspecified severe protein-calorie malnutrition: Secondary | ICD-10-CM

## 2020-12-27 DIAGNOSIS — M1990 Unspecified osteoarthritis, unspecified site: Secondary | ICD-10-CM | POA: Diagnosis not present

## 2020-12-27 DIAGNOSIS — F22 Delusional disorders: Secondary | ICD-10-CM | POA: Diagnosis not present

## 2021-01-25 DIAGNOSIS — R451 Restlessness and agitation: Secondary | ICD-10-CM | POA: Diagnosis not present

## 2021-03-01 DIAGNOSIS — F22 Delusional disorders: Secondary | ICD-10-CM | POA: Diagnosis not present

## 2021-03-01 DIAGNOSIS — M159 Polyosteoarthritis, unspecified: Secondary | ICD-10-CM | POA: Diagnosis not present

## 2021-03-01 DIAGNOSIS — F39 Unspecified mood [affective] disorder: Secondary | ICD-10-CM | POA: Diagnosis not present

## 2021-03-01 DIAGNOSIS — G301 Alzheimer's disease with late onset: Secondary | ICD-10-CM | POA: Diagnosis not present

## 2021-04-25 DIAGNOSIS — F39 Unspecified mood [affective] disorder: Secondary | ICD-10-CM | POA: Diagnosis not present

## 2021-04-25 DIAGNOSIS — M1990 Unspecified osteoarthritis, unspecified site: Secondary | ICD-10-CM | POA: Diagnosis not present

## 2021-04-25 DIAGNOSIS — F22 Delusional disorders: Secondary | ICD-10-CM | POA: Diagnosis not present

## 2021-04-25 DIAGNOSIS — G309 Alzheimer's disease, unspecified: Secondary | ICD-10-CM | POA: Diagnosis not present

## 2021-06-28 DIAGNOSIS — M159 Polyosteoarthritis, unspecified: Secondary | ICD-10-CM | POA: Diagnosis not present

## 2021-06-28 DIAGNOSIS — F39 Unspecified mood [affective] disorder: Secondary | ICD-10-CM | POA: Diagnosis not present

## 2021-06-28 DIAGNOSIS — G301 Alzheimer's disease with late onset: Secondary | ICD-10-CM | POA: Diagnosis not present

## 2021-06-28 DIAGNOSIS — E440 Moderate protein-calorie malnutrition: Secondary | ICD-10-CM | POA: Diagnosis not present

## 2021-08-24 DIAGNOSIS — F22 Delusional disorders: Secondary | ICD-10-CM | POA: Diagnosis not present

## 2021-08-24 DIAGNOSIS — N898 Other specified noninflammatory disorders of vagina: Secondary | ICD-10-CM

## 2021-08-24 DIAGNOSIS — E43 Unspecified severe protein-calorie malnutrition: Secondary | ICD-10-CM

## 2021-08-24 DIAGNOSIS — M1990 Unspecified osteoarthritis, unspecified site: Secondary | ICD-10-CM | POA: Diagnosis not present

## 2021-08-24 DIAGNOSIS — G309 Alzheimer's disease, unspecified: Secondary | ICD-10-CM | POA: Diagnosis not present

## 2021-08-24 DIAGNOSIS — F39 Unspecified mood [affective] disorder: Secondary | ICD-10-CM | POA: Diagnosis not present

## 2021-08-27 DIAGNOSIS — N898 Other specified noninflammatory disorders of vagina: Secondary | ICD-10-CM | POA: Diagnosis not present

## 2021-09-07 LAB — LIPID PANEL
Cholesterol: 205 — AB (ref 0–200)
HDL: 37 (ref 35–70)
Triglycerides: 414 — AB (ref 40–160)

## 2021-09-07 LAB — COMPREHENSIVE METABOLIC PANEL
Albumin: 3.9 (ref 3.5–5.0)
Calcium: 8.9 (ref 8.7–10.7)
Globulin: 3
eGFR: 80

## 2021-09-07 LAB — BASIC METABOLIC PANEL
BUN: 15 (ref 4–21)
CO2: 30 — AB (ref 13–22)
Chloride: 100 (ref 99–108)
Creatinine: 0.6 (ref 0.5–1.1)
Glucose: 71
Potassium: 3.7 mEq/L (ref 3.5–5.1)
Sodium: 138 (ref 137–147)

## 2021-09-07 LAB — HEPATIC FUNCTION PANEL
ALT: 9 U/L (ref 7–35)
AST: 14 (ref 13–35)
Alkaline Phosphatase: 49 (ref 25–125)
Bilirubin, Total: 0.5

## 2021-09-07 LAB — IRON,TIBC AND FERRITIN PANEL: Iron: 56

## 2021-12-21 DIAGNOSIS — F39 Unspecified mood [affective] disorder: Secondary | ICD-10-CM | POA: Diagnosis not present

## 2021-12-21 DIAGNOSIS — E43 Unspecified severe protein-calorie malnutrition: Secondary | ICD-10-CM | POA: Diagnosis not present

## 2021-12-21 DIAGNOSIS — F22 Delusional disorders: Secondary | ICD-10-CM | POA: Diagnosis not present

## 2021-12-21 DIAGNOSIS — G309 Alzheimer's disease, unspecified: Secondary | ICD-10-CM | POA: Diagnosis not present

## 2021-12-21 DIAGNOSIS — M1990 Unspecified osteoarthritis, unspecified site: Secondary | ICD-10-CM

## 2022-02-13 DIAGNOSIS — L989 Disorder of the skin and subcutaneous tissue, unspecified: Secondary | ICD-10-CM | POA: Diagnosis not present

## 2022-02-14 DIAGNOSIS — L570 Actinic keratosis: Secondary | ICD-10-CM | POA: Diagnosis not present

## 2022-02-28 DIAGNOSIS — G301 Alzheimer's disease with late onset: Secondary | ICD-10-CM

## 2022-02-28 DIAGNOSIS — E441 Mild protein-calorie malnutrition: Secondary | ICD-10-CM

## 2022-02-28 DIAGNOSIS — M159 Polyosteoarthritis, unspecified: Secondary | ICD-10-CM

## 2022-02-28 DIAGNOSIS — F39 Unspecified mood [affective] disorder: Secondary | ICD-10-CM

## 2022-03-04 LAB — COMPREHENSIVE METABOLIC PANEL
Albumin: 3.9 (ref 3.5–5.0)
Calcium: 9.1 (ref 8.7–10.7)
eGFR: 64

## 2022-03-04 LAB — BASIC METABOLIC PANEL
BUN: 16 (ref 4–21)
CO2: 27 — AB (ref 13–22)
Chloride: 101 (ref 99–108)
Creatinine: 0.8 (ref 0.5–1.1)
Glucose: 113
Potassium: 4.1 mEq/L (ref 3.5–5.1)
Sodium: 137 (ref 137–147)

## 2022-03-04 LAB — LIPID PANEL
Cholesterol: 187 (ref 0–200)
HDL: 41 (ref 35–70)
LDL Cholesterol: 113
Triglycerides: 210 — AB (ref 40–160)

## 2022-04-26 DIAGNOSIS — F39 Unspecified mood [affective] disorder: Secondary | ICD-10-CM | POA: Diagnosis not present

## 2022-04-26 DIAGNOSIS — E43 Unspecified severe protein-calorie malnutrition: Secondary | ICD-10-CM | POA: Diagnosis not present

## 2022-04-26 DIAGNOSIS — G309 Alzheimer's disease, unspecified: Secondary | ICD-10-CM | POA: Diagnosis not present

## 2022-04-26 DIAGNOSIS — F22 Delusional disorders: Secondary | ICD-10-CM

## 2022-04-26 DIAGNOSIS — M1990 Unspecified osteoarthritis, unspecified site: Secondary | ICD-10-CM | POA: Diagnosis not present

## 2022-06-24 DIAGNOSIS — F39 Unspecified mood [affective] disorder: Secondary | ICD-10-CM

## 2022-06-24 DIAGNOSIS — G301 Alzheimer's disease with late onset: Secondary | ICD-10-CM

## 2022-06-24 DIAGNOSIS — M159 Polyosteoarthritis, unspecified: Secondary | ICD-10-CM

## 2022-06-27 DIAGNOSIS — D1722 Benign lipomatous neoplasm of skin and subcutaneous tissue of left arm: Secondary | ICD-10-CM

## 2022-07-23 ENCOUNTER — Non-Acute Institutional Stay (SKILLED_NURSING_FACILITY): Payer: Medicare Other | Admitting: Nurse Practitioner

## 2022-07-23 DIAGNOSIS — B3731 Acute candidiasis of vulva and vagina: Secondary | ICD-10-CM

## 2022-07-23 DIAGNOSIS — Z66 Do not resuscitate: Secondary | ICD-10-CM

## 2022-07-23 NOTE — Progress Notes (Unsigned)
Location:  Other Nursing Home Room Number: 253 GUYQI of Service:  SNF (31) Provider:  Sherrie Mustache, NP  Dewayne Shorter, MD  Patient Care Team: Dewayne Shorter, MD as PCP - General [facility])  Extended Emergency Contact Information Primary Emergency Contact: [facility] Address: [address] [place] [address] Home Phone: [phone] Relation: None  Code Status:  DNR Goals of care: Advanced Directive information    07/23/2022    3:26 PM  Advanced Directives  Does Patient Have a Medical Advance Directive? Yes  Type of Advance Directive Out of facility DNR (pink MOST or yellow form)  Does patient want to make changes to medical advance directive? No - Patient declined     Chief Complaint  Patient presents with   Acute Visit    Vaginal rash    HPI:  Pt is a 88 y.o. female seen today for an acute visit for redness and rash to vaginal area.  Nursing noted redness during pericare to vaginal area.     Allergies  Allergen Reactions   Gentamicin Other (See Comments)   Sulfa Antibiotics Other (See Comments)    Outpatient Encounter Medications as of 07/23/2022  Medication Sig   acetaminophen (TYLENOL) 325 MG tablet Take 650 mg by mouth every 6 (six) hours as needed.   acetaminophen (TYLENOL) 650 MG CR tablet Take by mouth.   ALPRAZolam (XANAX) 0.25 MG tablet Take 0.25 mg by mouth every 8 (eight) hours as needed for anxiety.   buPROPion (WELLBUTRIN XL) 150 MG 24 hr tablet Take 150 mg by mouth daily.   guaiFENesin (MUCUS & CHEST CONGESTION) 100 MG/5ML LIQD Take 10 mLs by mouth every 4 (four) hours as needed.   memantine (NAMENDA) 10 MG tablet Take 10 mg by mouth 2 (two) times daily.   Menthol-Methyl Salicylate (BENGAY GREASELESS EX) Apply topically as needed. Apply to both knees   mirtazapine (REMERON) 7.5 MG tablet Take 7.5 mg by mouth at bedtime.   nystatin powder Apply 1 Application topically 2 (two) times daily as needed. Apply to groin  for yeast rash   sertraline (ZOLOFT) 100 MG tablet Take 100 mg by mouth daily.   traMADol (ULTRAM) 50 MG tablet Take by mouth every 8 (eight) hours as needed.   Vitamin D, Ergocalciferol, 50000 units CAPS Take by mouth.   No facility-administered encounter medications on file as of 07/23/2022.    Review of Systems  Unable to perform ROS: Dementia     There is no immunization history on file for this patient. There are no preventive care reminders to display for this patient.     No data to display         Functional Status Survey:    Vitals:   07/23/22 1510  BP: 122/70  Pulse: 76  Resp: 18  Temp: 98 F (36.7 C)  SpO2: 96%  Weight: 145 lb 14.4 oz (66.2 kg)  Height: 5' 3.5" (1.613 m)   Body mass index is 25.44 kg/m. Physical Exam Genitourinary:    Comments: Redness noted around vulva Neurological:     Mental Status: She is alert.     Labs reviewed: Recent Labs    09/07/21 0000 03/04/22 0000  NA 138 137  K 3.7 4.1  CL 100 101  CO2 30* 27*  BUN 15 16  CREATININE 0.6 0.8  CALCIUM 8.9 9.1   Recent Labs    09/07/21 0000 03/04/22 0000  AST 14  --  ALT 9  --   ALKPHOS 49  --   ALBUMIN 3.9 3.9   No results for input(s): "WBC", "NEUTROABS", "HGB", "HCT", "MCV", "PLT" in the last 8760 hours. No results found for: "TSH" No results found for: "HGBA1C" Lab Results  Component Value Date   CHOL 187 03/04/2022   HDL 41 03/04/2022   LDLCALC 113 03/04/2022   TRIG 210 (A) 03/04/2022    Significant Diagnostic Results in last 30 days:  No results found.  Assessment/Plan 1. Vaginal yeast infection Miconazole 4% to vaginal area BID for 7 days.  -notify if symptoms worsen or fail to improve.   2. DNR per facility record.   Carlos American. [place], [facility] [phone]

## 2022-08-09 NOTE — Addendum Note (Signed)
Addended by: Lauree Chandler on: 08/09/2022 01:35 PM   Modules accepted: Level of Service

## 2022-09-03 ENCOUNTER — Non-Acute Institutional Stay (SKILLED_NURSING_FACILITY): Payer: Medicare Other | Admitting: Nurse Practitioner

## 2022-09-03 ENCOUNTER — Encounter: Payer: Self-pay | Admitting: Nurse Practitioner

## 2022-09-03 DIAGNOSIS — G301 Alzheimer's disease with late onset: Secondary | ICD-10-CM | POA: Diagnosis not present

## 2022-09-03 DIAGNOSIS — F419 Anxiety disorder, unspecified: Secondary | ICD-10-CM | POA: Diagnosis not present

## 2022-09-03 DIAGNOSIS — M159 Polyosteoarthritis, unspecified: Secondary | ICD-10-CM | POA: Diagnosis not present

## 2022-09-03 DIAGNOSIS — F02C0 Dementia in other diseases classified elsewhere, severe, without behavioral disturbance, psychotic disturbance, mood disturbance, and anxiety: Secondary | ICD-10-CM

## 2022-09-03 DIAGNOSIS — F3342 Major depressive disorder, recurrent, in full remission: Secondary | ICD-10-CM | POA: Diagnosis not present

## 2022-09-03 DIAGNOSIS — R630 Anorexia: Secondary | ICD-10-CM

## 2022-09-03 NOTE — Progress Notes (Signed)
Location:   [place] Room Number: [place] of Service:  SNF [phone]) Provider:  Sherrie Mustache, NP  Dewayne Shorter, MD  Patient Care Team: Dewayne Shorter, MD as PCP - General (Family Medicine)  Extended Emergency Contact Information Primary Emergency Contact: [facility] Address: [address] [place] [address] Home Phone: [phone] Relation: None  Code Status:  DNR Goals of care: Advanced Directive information    09/03/2022    2:26 PM  Advanced Directives  Does Patient Have a Medical Advance Directive? Yes  Type of Advance Directive Out of facility DNR (pink MOST or yellow form)  Does patient want to make changes to medical advance directive? No - Patient declined  Pre-existing out of facility DNR order (yellow form or pink MOST form) Yellow form placed in chart (order not valid for inpatient use)     Chief Complaint  Patient presents with   Medical Management of Chronic Issues    Routine follow up   Immunizations    COVID booster, tetanus/tdap, shingrix, pneumonia, flu vaccine   Quality Metric Gaps    Medicare annual wellness, dexa scan due    HPI:  Pt is a 88 y.o. female seen today for medical management of chronic diseases.   Pt with hx of advanced dementia, OA, mood disorder.  She is doing well at this time. No concerns per nursing.  No signs of anxiety or depression at this time Gets up to [employer], staff feeds residents and assist with all ALDS Uses tylenol three times daily due to pain, no signs of pain today.    History reviewed. No pertinent past medical history. History reviewed. No pertinent surgical history.  Allergies  Allergen Reactions   Gentamicin Other (See Comments)   Sulfa Antibiotics Other (See Comments)    Allergies as of 09/03/2022       Reactions   Gentamicin Other (See Comments)   Sulfa Antibiotics Other (See Comments)        Medication List        Accurate  as of September 03, 2022  2:46 PM. If you have any questions, ask your nurse or doctor.          acetaminophen 325 MG tablet Commonly known as: TYLENOL Take 650 mg by mouth every 6 (six) hours as needed.   acetaminophen 650 MG CR tablet Commonly known as: TYLENOL Take 650 mg by mouth 3 (three) times daily.   ALPRAZolam 0.25 MG tablet Commonly known as: XANAX Take 0.25 mg by mouth every 8 (eight) hours as needed for anxiety.   BENGAY GREASELESS EX Apply topically as needed. Apply to both knees   buPROPion 150 MG 24 hr tablet Commonly known as: WELLBUTRIN XL Take 150 mg by mouth daily.   Dermacloud Oint Apply topically as needed. Apply to groin, thigh and buttock every day and evening shift   memantine 10 MG tablet Commonly known as: NAMENDA Take 10 mg by mouth 2 (two) times daily.   mirtazapine 7.5 MG tablet Commonly known as: REMERON Take 7.5 mg by mouth at bedtime.   Mucus & Chest Congestion 100 MG/5ML Liqd Take 10 mLs by mouth every 4 (four) hours as needed.   nystatin powder Generic drug: nystatin Apply 1 Application topically 2 (two) times daily as needed. Apply to groin for yeast rash   sertraline 100 MG tablet Commonly known as: ZOLOFT Take 100 mg by mouth daily.  SERTRALINE HCL PO Take 75 mg by mouth.   traMADol 50 MG tablet Commonly known as: ULTRAM Take by mouth every 8 (eight) hours as needed.   Vitamin D (Ergocalciferol) 50000 units Caps Take by mouth.        Review of Systems  Unable to perform ROS: Dementia    Immunization History  Administered Date(s) Administered   Influenza-Unspecified 08/20/2022   Moderna Covid-19 Vaccine Bivalent Booster 88yr & up 11/15/2019, 12/13/2019   Pneumococcal-Unspecified 02/16/2015   Unspecified SARS-COV-2 Vaccination 09/15/2020, 03/23/2021, 07/27/2021, 04/02/2022   Pertinent  Health Maintenance Due  Topic Date Due   DEXA SCAN  Never done   INFLUENZA VACCINE  Completed       No data to display          Functional Status Survey:    Vitals:   09/03/22 1358  BP: 129/79  Pulse: 95  Resp: 20  Temp: (!) 97.5 F (36.4 C)  SpO2: 94%  Weight: 142 lb 9.6 oz (64.7 kg)  Height: 5' 3.5" (1.613 m)   Body mass index is 24.86 kg/m. Physical Exam Constitutional:      General: She is not in acute distress.    Appearance: She is well-developed. She is not diaphoretic.  HENT:     Head: Normocephalic and atraumatic.     Mouth/Throat:     Pharynx: No oropharyngeal exudate.  Eyes:     Conjunctiva/sclera: Conjunctivae normal.     Pupils: Pupils are equal, round, and reactive to light.  Cardiovascular:     Rate and Rhythm: Normal rate and regular rhythm.     Heart sounds: Normal heart sounds.  Pulmonary:     Effort: Pulmonary effort is normal.     Breath sounds: Normal breath sounds.  Abdominal:     General: Bowel sounds are normal.     Palpations: Abdomen is soft.  Musculoskeletal:     Cervical back: Normal range of motion and neck supple.     Right lower leg: No edema.     Left lower leg: No edema.  Skin:    General: Skin is warm and dry.  Neurological:     Mental Status: She is alert. Mental status is at baseline.     Gait: Gait abnormal.  Psychiatric:        Mood and Affect: Mood normal.     Labs reviewed: Recent Labs    09/07/21 0000 03/04/22 0000  NA 138 137  K 3.7 4.1  CL 100 101  CO2 30* 27*  BUN 15 16  CREATININE 0.6 0.8  CALCIUM 8.9 9.1   Recent Labs    09/07/21 0000 03/04/22 0000  AST 14  --   ALT 9  --   ALKPHOS 49  --   ALBUMIN 3.9 3.9   No results for input(s): "WBC", "NEUTROABS", "HGB", "HCT", "MCV", "PLT" in the last 8760 hours. No results found for: "TSH" No results found for: "HGBA1C" Lab Results  Component Value Date   CHOL 187 03/04/2022   HDL 41 03/04/2022   LDLCALC 113 03/04/2022   TRIG 210 (A) 03/04/2022    Significant Diagnostic Results in last 30 days:  No results found.  Assessment/Plan 1. 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 Recurrent major depressive disorder, in full remission (HEva -stable, she is on remeron, zoloft and welbutrin,  will decrease zoloft to 75 mg at this time.   3. Anxiety -controlled on current regimen, will decrease zoloft to 75 mg at this time, if good results will continue with GDR  4. Osteoarthritis of multiple joints, unspecified osteoarthritis type -stable, continues on scheduled tylenol  5. Decreased appetite -on remeron, weight stable.   Carlos American. [place], [facility] [phone]

## 2022-11-01 ENCOUNTER — Non-Acute Institutional Stay (SKILLED_NURSING_FACILITY): Payer: Medicare Other | Admitting: Student

## 2022-11-01 ENCOUNTER — Encounter: Payer: Self-pay | Admitting: Student

## 2022-11-01 DIAGNOSIS — I10 Essential (primary) hypertension: Secondary | ICD-10-CM

## 2022-11-01 DIAGNOSIS — G301 Alzheimer's disease with late onset: Secondary | ICD-10-CM

## 2022-11-01 DIAGNOSIS — F02C0 Dementia in other diseases classified elsewhere, severe, without behavioral disturbance, psychotic disturbance, mood disturbance, and anxiety: Secondary | ICD-10-CM

## 2022-11-01 DIAGNOSIS — F3342 Major depressive disorder, recurrent, in full remission: Secondary | ICD-10-CM

## 2022-11-01 DIAGNOSIS — F419 Anxiety disorder, unspecified: Secondary | ICD-10-CM

## 2022-11-01 DIAGNOSIS — E785 Hyperlipidemia, unspecified: Secondary | ICD-10-CM

## 2022-11-01 NOTE — Progress Notes (Signed)
Location:  Other [place].  Nursing Home Room Number: [place] of Service:  SNF [phone]) Provider:  Dr. Amada Kingfisher, MD  Patient Care Team: Dewayne Shorter, MD as PCP - General [facility] Medicine)  Extended Emergency Contact Information Primary Emergency Contact: [facility] Address: [address] [place] [address] Home Phone: [phone] Relation: None  Code Status:  DNR Goals of care: Advanced Directive information    11/01/2022    9:42 AM  Advanced Directives  Does Patient Have a Medical Advance Directive? Yes  Type of Advance Directive Out of facility DNR (pink MOST or yellow form)  Does patient want to make changes to medical advance directive? No - Patient declined     Chief Complaint  Patient presents with   Medical Management of Chronic Issues    Medical Management of Chronic Issues.     HPI:  Pt is a 88 y.o. female seen today for medical management of chronic diseases.  Nursing have no concerns regarding patient at this time except for poorly healing operative area of the shoulder from [facility].   Patient is sitting in the sun room by herself. It is dinner time. She states she isn't that hungry. She states the year is 2022 and knows Christmas just occurred. When asked if she has children, she says yes, you are my daughter right? She also states thank you sir for coming to see me. She notes the MD on nametag and says thank you sir.   She has trouble hearing numerous questions. She states some non-sensical phrases. 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    History reviewed. No pertinent past medical history. History reviewed. No pertinent surgical history.  Allergies  Allergen Reactions   Ambien [Zolpidem]    Gentamicin Other (See Comments)   Hydrocodone    Penicillins    Sulfa Antibiotics Other (See Comments)   Trazodone And Nefazodone    Yellow Dyes  (Non-Tartrazine)     Outpatient Encounter Medications as of 11/01/2022  Medication Sig   acetaminophen (TYLENOL) 325 MG tablet Take 650 mg by mouth every 6 (six) hours as needed.   acetaminophen (TYLENOL) 650 MG CR tablet Take 650 mg by mouth 3 (three) times daily.   ALPRAZolam (XANAX) 0.25 MG tablet Take 0.25 mg by mouth every 8 (eight) hours as needed for anxiety.   buPROPion (WELLBUTRIN XL) 150 MG 24 hr tablet Take 150 mg by mouth daily.   fluoruracil (CARAC) 0.5 % cream Apply to left upper limb topically daily and evening shift 14 days on and 7 days off for rash. Apply twice daily to area for 2 weeks on then hold for 1 week and continue to alternate.   guaiFENesin (MUCUS & CHEST CONGESTION) 100 MG/5ML LIQD Take 10 mLs by mouth every 4 (four) hours as needed.   Infant Care Products [facility]) OINT Apply topically as needed. Apply to groin, thigh and buttock every day and evening shift   memantine (NAMENDA) 10 MG tablet Take 10 mg by mouth 2 (two) times daily.   Menthol-Methyl Salicylate (BENGAY GREASELESS EX) Apply topically as needed. Apply to both knees   mirtazapine (REMERON) 7.5 MG tablet Take 7.5 mg by mouth at bedtime.   nystatin powder Apply 1 Application topically 2 (two) times daily as needed. Apply to groin for yeast rash   SERTRALINE HCL PO Take 75 mg  by mouth daily.   Vitamin D, Ergocalciferol, 50000 units CAPS Take 1 capsule by mouth daily.   [DISCONTINUED] sertraline (ZOLOFT) 100 MG tablet Take 100 mg by mouth daily.   [DISCONTINUED] traMADol (ULTRAM) 50 MG tablet Take by mouth every 8 (eight) hours as needed.   No facility-administered encounter medications on file as of 11/01/2022.    Review of Systems  Unable to perform ROS: Dementia    Immunization History  Administered Date(s) Administered   Influenza-Unspecified 08/20/2022   Moderna Covid-19 Vaccine Bivalent Booster 100yr & up 11/15/2019, 12/13/2019   Pneumococcal-Unspecified 02/16/2015   Unspecified  SARS-COV-2 Vaccination 09/15/2020, 03/23/2021, 07/27/2021, 04/02/2022   Pertinent  Health Maintenance Due  Topic Date Due   DEXA SCAN  Never done   INFLUENZA VACCINE  Completed       No data to display         Functional Status Survey:    There were no vitals filed for this visit. There is no height or weight on file to calculate BMI. Physical Exam HENT:     Mouth/Throat:     Mouth: Mucous membranes are moist.  Cardiovascular:     Rate and Rhythm: Normal rate.     Pulses: Normal pulses.     Heart sounds: Normal heart sounds.  Pulmonary:     Breath sounds: Normal breath sounds.  Abdominal:     General: Bowel sounds are normal.     Palpations: Abdomen is soft.  Skin:    Capillary Refill: Capillary refill takes less than 2 seconds.  Neurological:     General: No focal deficit present.     Mental Status: She is alert. Mental status is at baseline. She is disoriented.     Labs reviewed: Recent Labs    03/04/22 0000  NA 137  K 4.1  CL 101  CO2 27*  BUN 16  CREATININE 0.8  CALCIUM 9.1   Recent Labs    03/04/22 0000  ALBUMIN 3.9   No results for input(s): "WBC", "NEUTROABS", "HGB", "HCT", "MCV", "PLT" in the last 8760 hours. No results found for: "TSH" No results found for: "HGBA1C" Lab Results  Component Value Date   CHOL 187 03/04/2022   HDL 41 03/04/2022   LDLCALC 113 03/04/2022   TRIG 210 (A) 03/04/2022    Significant Diagnostic Results in last 30 days:  No results found.  Assessment/Plan 1. Severe late onset Alzheimer's dementia without behavioral disturbance, psychotic disturbance, mood disturbance, or anxiety ([place] Patient with severe Alzheimer's disoriented at baseline.  Weight is stable.  Continue Namenda 10 mg.  Continue continue Wellbutrin  2. Recurrent major depressive disorder, in full remission ([place] 3. Anxiety Per nursing patient mood has been stable.  Continue sertraline 75 mg daily and and mirtazapine 7.5 mg nightly.  Continue  Wellbutrin 150 mg daily, consider continue conversation with family regarding patient's anxiety and decreasing dose of bupropion.  Patient also has Xanax 0.25 mg every 8 as needed, given patient's age this medication is concerning, however will continue goals of care conversation regarding patient's anxiety age and treatment.  4. Benign essential hypertension BP well-controlled without medications at this time continue to monitor.  5. Hyperlipidemia, unspecified hyperlipidemia type No medications at this time continue to monitor with annual labs.   Family/ staff Communication: nursing, will call family at a later date.   Labs/tests ordered:  none

## 2022-11-02 DIAGNOSIS — F419 Anxiety disorder, unspecified: Secondary | ICD-10-CM | POA: Insufficient documentation

## 2022-11-02 DIAGNOSIS — F3342 Major depressive disorder, recurrent, in full remission: Secondary | ICD-10-CM | POA: Insufficient documentation

## 2022-11-02 DIAGNOSIS — G301 Alzheimer's disease with late onset: Secondary | ICD-10-CM | POA: Insufficient documentation

## 2023-01-01 ENCOUNTER — Encounter: Payer: Self-pay | Admitting: Student

## 2023-01-01 ENCOUNTER — Non-Acute Institutional Stay (SKILLED_NURSING_FACILITY): Payer: Medicare Other | Admitting: Student

## 2023-01-01 DIAGNOSIS — I10 Essential (primary) hypertension: Secondary | ICD-10-CM | POA: Diagnosis not present

## 2023-01-01 DIAGNOSIS — F3342 Major depressive disorder, recurrent, in full remission: Secondary | ICD-10-CM | POA: Diagnosis not present

## 2023-01-01 DIAGNOSIS — G301 Alzheimer's disease with late onset: Secondary | ICD-10-CM | POA: Diagnosis not present

## 2023-01-01 DIAGNOSIS — F02C0 Dementia in other diseases classified elsewhere, severe, without behavioral disturbance, psychotic disturbance, mood disturbance, and anxiety: Secondary | ICD-10-CM

## 2023-01-01 DIAGNOSIS — E785 Hyperlipidemia, unspecified: Secondary | ICD-10-CM | POA: Diagnosis not present

## 2023-01-01 DIAGNOSIS — C44619 Basal cell carcinoma of skin of left upper limb, including shoulder: Secondary | ICD-10-CM

## 2023-01-01 NOTE — Progress Notes (Signed)
Location:  Other [place].  Nursing Home Room Number: [place] of Service:  SNF [phone]) Provider:  Dewayne Shorter, MD  Patient Care Team: Dewayne Shorter, MD as PCP - General [facility] Medicine)  Extended Emergency Contact Information Primary Emergency Contact: [facility] Address: [address] [place] [address] Home Phone: [phone] Relation: None  Code Status:  DNR Goals of care: Advanced Directive information    01/01/2023    8:39 AM  Advanced Directives  Does Patient Have a Medical Advance Directive? Yes  Type of Paramedic of [place];Out of facility DNR (pink MOST or yellow form);Living will  Does patient want to make changes to medical advance directive? No - Patient declined  Copy of [place] in Chart? No - copy requested     Chief Complaint  Patient presents with  . Medical Management of Chronic Issues    Medical Management of Chronic Issues.     HPI:  Pt is a 89 y.o. female seen today for medical management of chronic diseases.     Spoke with patient's son regarding patient's current status. Stable condition at this time. Discussed concerns regarding her health. Desire to maintain her quality of life.  History reviewed. No pertinent past medical history. History reviewed. No pertinent surgical history.  Allergies  Allergen Reactions  . Ambien [Zolpidem]   . Gentamicin Other (See Comments)  . Hydrocodone   . Penicillins   . Sulfa Antibiotics Other (See Comments)  . Trazodone And Nefazodone   . Yellow Dyes (Non-Tartrazine)     Outpatient Encounter Medications as of 01/01/2023  Medication Sig  . acetaminophen (TYLENOL) 325 MG tablet Take 650 mg by mouth every 6 (six) hours as needed.  Marland Kitchen acetaminophen (TYLENOL) 650 MG CR tablet Take 650 mg by mouth 3 (three) times daily.  Marland Kitchen buPROPion (WELLBUTRIN XL) 150 MG 24 hr tablet Take 150 mg by mouth daily.  . fluoruracil (CARAC) 0.5  % cream Apply to left upper limb topically daily and evening shift 14 days on and 7 days off for rash. Apply twice daily to area for 2 weeks on then hold for 1 week and continue to alternate.  Marland Kitchen guaiFENesin (MUCUS & CHEST CONGESTION) 100 MG/5ML LIQD Take 10 mLs by mouth every 4 (four) hours as needed.  . [place] [facility]) OINT Apply topically as needed. Apply to groin, thigh and buttock every day and evening shift  . memantine (NAMENDA) 10 MG tablet Take 10 mg by mouth 2 (two) times daily.  . Menthol-Methyl Salicylate (BENGAY GREASELESS EX) Apply topically as needed. Apply to both knees  . mirtazapine (REMERON) 7.5 MG tablet Take 7.5 mg by mouth at bedtime.  Marland Kitchen nystatin powder Apply 1 Application topically 2 (two) times daily as needed. Apply to groin for yeast rash  . Protein (PROSOURCE PO) Mix one packet in 8 oz of juice daily.  . SERTRALINE HCL PO Take 75 mg by mouth daily.  . Vitamin D, Ergocalciferol, 50000 units CAPS Take 1 capsule by mouth daily.  . [DISCONTINUED] ALPRAZolam (XANAX) 0.25 MG tablet Take 0.25 mg by mouth every 8 (eight) hours as needed for anxiety.   No facility-administered encounter medications on file as of 01/01/2023.    Review of Systems  Immunization History  Administered Date(s) Administered  . Influenza-Unspecified 08/22/2020, 08/20/2021, 08/20/2022  . Moderna Covid-19 Vaccine Bivalent Booster 38yr & up 11/15/2019, 12/13/2019  . Pneumococcal-Unspecified 02/16/2015  .  Unspecified SARS-COV-2 Vaccination 09/15/2020, 03/23/2021, 07/27/2021, 04/02/2022   Pertinent  Health Maintenance Due  Topic Date Due  . DEXA SCAN  Never done  . INFLUENZA VACCINE  Completed       No data to display         Functional Status Survey:    Vitals:   01/01/23 0825  BP: 120/74  Pulse: 84  Resp: 18  Temp: (!) 97.5 F (36.4 C)  SpO2: 97%  Weight: 137 lb 4.8 oz (62.3 kg)  Height: 5' 3.5" (1.613 m)   Body mass index is 23.94 kg/m. Physical Exam  Labs  reviewed: Recent Labs    03/04/22 0000  NA 137  K 4.1  CL 101  CO2 27*  BUN 16  CREATININE 0.8  CALCIUM 9.1   Recent Labs    03/04/22 0000  ALBUMIN 3.9   No results for input(s): "WBC", "NEUTROABS", "HGB", "HCT", "MCV", "PLT" in the last 8760 hours. No results found for: "TSH" No results found for: "HGBA1C" Lab Results  Component Value Date   CHOL 187 03/04/2022   HDL 41 03/04/2022   LDLCALC 113 03/04/2022   TRIG 210 (A) 03/04/2022    Significant Diagnostic Results in last 30 days:  No results found.  Assessment/Plan There are no diagnoses linked to this encounter.   Family/ staff Communication: ***  Labs/tests ordered:  ***

## 2023-02-11 ENCOUNTER — Encounter: Payer: Self-pay | Admitting: Nurse Practitioner

## 2023-02-11 ENCOUNTER — Non-Acute Institutional Stay (SKILLED_NURSING_FACILITY): Payer: Medicare Other | Admitting: Nurse Practitioner

## 2023-02-11 DIAGNOSIS — I10 Essential (primary) hypertension: Secondary | ICD-10-CM

## 2023-02-11 DIAGNOSIS — G301 Alzheimer's disease with late onset: Secondary | ICD-10-CM

## 2023-02-11 DIAGNOSIS — F419 Anxiety disorder, unspecified: Secondary | ICD-10-CM

## 2023-02-11 DIAGNOSIS — F3342 Major depressive disorder, recurrent, in full remission: Secondary | ICD-10-CM

## 2023-02-11 DIAGNOSIS — F02C0 Dementia in other diseases classified elsewhere, severe, without behavioral disturbance, psychotic disturbance, mood disturbance, and anxiety: Secondary | ICD-10-CM

## 2023-02-11 DIAGNOSIS — R630 Anorexia: Secondary | ICD-10-CM

## 2023-02-11 DIAGNOSIS — M159 Polyosteoarthritis, unspecified: Secondary | ICD-10-CM

## 2023-02-11 NOTE — Progress Notes (Signed)
Location:  Other [facility]) Nursing Home Room Number: 510 A Place of Service:  SNF (31)  Earnestine Mealing, MD  Patient Care Team: Earnestine Mealing, MD as PCP - General [facility] Medicine)  Extended Emergency Contact Information Primary Emergency Contact: [facility] [address] Address: [phone]          [name], [place] [address] [name] Home Phone: [phone] Mobile Phone: [phone] Relation: None  Goals of care: Advanced Directive information    01/01/2023    8:39 AM  Advanced Directives  Does Patient Have a Medical Advance Directive? Yes  Type of Estate agent of [place];Out of facility DNR (pink MOST or yellow form);Living will  Does patient want to make changes to medical advance directive? No - Patient declined  Copy of Healthcare Power of Attorney in Chart? No - copy requested     Chief Complaint  Patient presents with   Medical Management of Chronic Issues    Routine visit. Discuss need for td/tdap, shingrix, pneumonia vaccine, DEXA, and covid boosters or post pone if patient refuses or is not a candidate. NCIR verified.     HPI:  Pt is a 89 y.o. female seen today for medical management of chronic disease.  Pt with hx of dementia, anxiety and depression, OA, htn, hyperlipidemia.  She has been stable per nursing. Occasionally some anxiety noted.  Has days that she does not eat well but then makes up for it the next day. Her weights have been stable.   No past medical history on file. No past surgical history on file.  Allergies  Allergen Reactions   Ambien [Zolpidem]    Gentamicin Other (See Comments)   Hydrocodone    Penicillins    Sulfa Antibiotics Other (See Comments)   Trazodone And Nefazodone    Yellow Dyes (Non-Tartrazine)     Outpatient Encounter Medications as of 02/11/2023  Medication Sig   acetaminophen (TYLENOL) 325 MG tablet Take 650 mg by mouth every 6 (six) hours as needed.   acetaminophen (TYLENOL)  650 MG CR tablet Take 650 mg by mouth 3 (three) times daily.   buPROPion (WELLBUTRIN XL) 150 MG 24 hr tablet Take 150 mg by mouth daily.   guaiFENesin (MUCUS & CHEST CONGESTION) 100 MG/5ML LIQD Take 10 mLs by mouth every 4 (four) hours as needed.   Infant Care Products [facility]) OINT Apply topically as needed. Apply to groin, thigh and buttock every day and evening shift   memantine (NAMENDA) 10 MG tablet Take 10 mg by mouth 2 (two) times daily.   Menthol-Methyl Salicylate (BENGAY GREASELESS EX) Apply topically as needed. Apply to both knees   mirtazapine (REMERON) 7.5 MG tablet Take 7.5 mg by mouth at bedtime.   nystatin powder Apply 1 Application topically 2 (two) times daily as needed. Apply to groin for yeast rash   Protein (PROSOURCE PO) Mix one packet in 8 oz of juice daily.   sertraline (ZOLOFT) 50 MG tablet Take 50 mg by mouth daily.   Vitamin D, Ergocalciferol, 50000 units CAPS Take 1 capsule by mouth daily.   [DISCONTINUED] fluoruracil (CARAC) 0.5 % cream Apply to left upper limb topically daily and evening shift 14 days on and 7 days off for rash. Apply twice daily to area for 2 weeks on then hold for 1 week and continue to alternate.   [DISCONTINUED] SERTRALINE HCL PO Take 75 mg by mouth daily.   No facility-administered encounter medications on file as of 02/11/2023.    Review  of Systems  Unable to perform ROS: Dementia     Immunization History  Administered Date(s) Administered   Influenza-Unspecified 08/22/2020, 08/20/2021, 08/20/2022   Moderna Covid-19 Vaccine Bivalent Booster 6yrs & up 11/15/2019, 12/13/2019   Pneumococcal-Unspecified 02/16/2015   Unspecified SARS-COV-2 Vaccination 09/15/2020, 03/23/2021, 07/27/2021, 04/02/2022   Pertinent  Health Maintenance Due  Topic Date Due   DEXA SCAN  Never done   INFLUENZA VACCINE  06/05/2023       No data to display         Functional Status Survey:    Vitals:   02/11/23 1210  BP: 112/67  Pulse: 89  Temp: (!)  97.5 F (36.4 C)  Weight: 138 lb (62.6 kg)  Height: 5' 3.5" (1.613 m)   Body mass index is 24.06 kg/m. Physical Exam Constitutional:      General: She is not in acute distress.    Appearance: She is well-developed. She is not diaphoretic.  HENT:     Head: Normocephalic and atraumatic.     Mouth/Throat:     Pharynx: No oropharyngeal exudate.  Eyes:     Conjunctiva/sclera: Conjunctivae normal.     Pupils: Pupils are equal, round, and reactive to light.  Cardiovascular:     Rate and Rhythm: Normal rate and regular rhythm.     Heart sounds: Normal heart sounds.  Pulmonary:     Effort: Pulmonary effort is normal.     Breath sounds: Normal breath sounds.  Abdominal:     General: Bowel sounds are normal.     Palpations: Abdomen is soft.  Musculoskeletal:     Cervical back: Normal range of motion and neck supple.     Right lower leg: No edema.     Left lower leg: No edema.  Skin:    General: Skin is warm and dry.  Neurological:     Mental Status: She is lethargic and disoriented.     Motor: Weakness present.     Gait: Gait abnormal.  Psychiatric:        Mood and Affect: Mood normal.     Labs reviewed: Recent Labs    03/04/22 0000  NA 137  K 4.1  CL 101  CO2 27*  BUN 16  CREATININE 0.8  CALCIUM 9.1   Recent Labs    03/04/22 0000  ALBUMIN 3.9   No results for input(s): "WBC", "NEUTROABS", "HGB", "HCT", "MCV", "PLT" in the last 8760 hours. No results found for: "TSH" No results found for: "HGBA1C" Lab Results  Component Value Date   CHOL 187 03/04/2022   HDL 41 03/04/2022   LDLCALC 113 03/04/2022   TRIG 210 (A) 03/04/2022    Significant Diagnostic Results in last 30 days:  No results found.  Assessment/Plan 1. Severe late onset Alzheimer's dementia without behavioral disturbance, psychotic disturbance, mood disturbance, or anxiety -advanced, no acute changes in cognitive or functional status, continue supportive care. Continues on namenda but doubt any  benefit at this time. She is total care.   2. Recurrent major depressive disorder, in full remission Stable, will titrate off wellbutritin at this time  3. Benign essential hypertension -well controlled, not current on any medication at this time  4. Anxiety -occasionally will have some anxiety, continues on zoloft.   5. Osteoarthritis of multiple joints, unspecified osteoarthritis type Stable on scheduled tylenol. Will continue.   6. Decreased appetite Weight remains stable, continues on remeron.     Janene Harvey. Biagio Borg [facility] Adult Medicine [phone]

## 2023-03-05 LAB — LIPID PANEL
Cholesterol: 175 (ref 0–200)
HDL: 39 (ref 35–70)
LDL Cholesterol: 102
Triglycerides: 219 — AB (ref 40–160)

## 2023-03-05 LAB — COMPREHENSIVE METABOLIC PANEL
Albumin: 3.6 (ref 3.5–5.0)
Calcium: 8.7 (ref 8.7–10.7)

## 2023-03-05 LAB — BASIC METABOLIC PANEL
BUN: 17 (ref 4–21)
CO2: 30 — AB (ref 13–22)
Chloride: 104 (ref 99–108)
Creatinine: 0.8 (ref 0.5–1.1)
Glucose: 75
Potassium: 4.1 mEq/L (ref 3.5–5.1)
Sodium: 141 (ref 137–147)

## 2023-03-18 ENCOUNTER — Encounter: Payer: Self-pay | Admitting: Nurse Practitioner

## 2023-03-18 ENCOUNTER — Non-Acute Institutional Stay (SKILLED_NURSING_FACILITY): Payer: Medicare Other | Admitting: Nurse Practitioner

## 2023-03-18 DIAGNOSIS — F02C3 Dementia in other diseases classified elsewhere, severe, with mood disturbance: Secondary | ICD-10-CM

## 2023-03-18 DIAGNOSIS — G301 Alzheimer's disease with late onset: Secondary | ICD-10-CM

## 2023-03-18 DIAGNOSIS — R10817 Generalized abdominal tenderness: Secondary | ICD-10-CM

## 2023-03-18 DIAGNOSIS — M159 Polyosteoarthritis, unspecified: Secondary | ICD-10-CM

## 2023-03-18 DIAGNOSIS — I10 Essential (primary) hypertension: Secondary | ICD-10-CM

## 2023-03-18 DIAGNOSIS — F419 Anxiety disorder, unspecified: Secondary | ICD-10-CM | POA: Diagnosis not present

## 2023-03-18 DIAGNOSIS — R630 Anorexia: Secondary | ICD-10-CM

## 2023-03-18 MED ORDER — ALPRAZOLAM 0.25 MG PO TABS: 0.2500 mg | ORAL_TABLET | Freq: Every day | ORAL | 0 refills | Status: DC | PRN

## 2023-03-18 NOTE — Progress Notes (Signed)
Location:  Other [place].  Nursing Home Room Number: [facility] [address] Place of Service:  SNF [phone]) Abbey Chatters, NP  PCP: Earnestine Mealing, MD  Patient Care Team: Earnestine Mealing, MD as PCP - General [facility] Medicine)  Extended Emergency Contact Information Primary Emergency Contact: [facility] Address: [phone]          [name], [place] [address] [name] Home Phone: [phone] Mobile Phone: [phone] Relation: None  Goals of care: Advanced Directive information    03/18/2023    2:01 PM  Advanced Directives  Does Patient Have a Medical Advance Directive? Yes  Type of Estate agent of [place];Living will;Out of facility DNR (pink MOST or yellow form)  Does patient want to make changes to medical advance directive? No - Patient declined  Copy of Healthcare Power of Attorney in Chart? Yes - validated most recent copy scanned in chart (See row information)     Chief Complaint  Patient presents with   Medical Management of Chronic Issues    Medical Management of Chronic Issues.    Acute Visit    Increased Agitation.     HPI:  Pt is a 89 y.o. female seen today for medical management of chronic disease. Pt with dementia. Nursing reports increase in anxiety during the day and night and yelling out. She had PRN xanax daily but order expired. No signs of acute pain but will yell out, she has chronic pain due to OA.  She will be combative with staff.  Weight has been trending down over the last few months but continues to have support from staff.  Occasionally will have elevated bp but overall controlled.     History reviewed. No pertinent past medical history. History reviewed. No pertinent surgical history.  Allergies  Allergen Reactions   Ambien [Zolpidem]    Gentamicin Other (See Comments)   Hydrocodone    Penicillins    Sulfa Antibiotics Other (See Comments)   Trazodone And Nefazodone    Yellow Dyes  (Non-Tartrazine)     Outpatient Encounter Medications as of 03/18/2023  Medication Sig   acetaminophen (TYLENOL) 325 MG tablet Take 650 mg by mouth every 6 (six) hours as needed.   acetaminophen (TYLENOL) 650 MG CR tablet Take 650 mg by mouth 3 (three) times daily.   guaiFENesin (MUCUS & CHEST CONGESTION) 100 MG/5ML LIQD Take 10 mLs by mouth every 4 (four) hours as needed.   Infant Care Products [facility]) OINT Apply topically as needed. Apply to groin, thigh and buttock every day and evening shift   memantine (NAMENDA) 10 MG tablet Take 10 mg by mouth 2 (two) times daily.   Menthol-Methyl Salicylate (BENGAY GREASELESS EX) Apply topically as needed. Apply to both knees   mirtazapine (REMERON) 7.5 MG tablet Take 7.5 mg by mouth at bedtime.   nystatin powder Apply 1 Application topically 2 (two) times daily as needed. Apply to groin for yeast rash   Protein (PROSOURCE PO) Mix one packet in 8 oz of juice daily.   sertraline (ZOLOFT) 50 MG tablet Take 50 mg by mouth daily.   Vitamin D, Ergocalciferol, 50000 units CAPS Take 1 capsule by mouth daily.   [DISCONTINUED] ALPRAZolam (XANAX) 0.25 MG tablet Take 1 tablet (0.25 mg total) by mouth daily as needed for anxiety.   [DISCONTINUED] buPROPion (WELLBUTRIN XL) 150 MG 24 hr tablet Take 150 mg by mouth daily.   No facility-administered encounter medications on file as of 03/18/2023.    Review of  Systems  Unable to perform ROS: Dementia     Immunization History  Administered Date(s) Administered   Influenza-Unspecified 08/22/2020, 08/20/2021, 08/20/2022   Moderna Covid-19 Vaccine Bivalent Booster 68yrs & up 11/15/2019, 12/13/2019   Pneumococcal-Unspecified 02/16/2015   Unspecified SARS-COV-2 Vaccination 09/15/2020, 03/23/2021, 07/27/2021, 04/02/2022   Pertinent  Health Maintenance Due  Topic Date Due   DEXA SCAN  Never done   INFLUENZA VACCINE  06/05/2023       No data to display         Functional Status Survey:    Vitals:    03/18/23 1356  BP: 124/73  Pulse: 74  Resp: 20  Temp: 97.6 F (36.4 C)  SpO2: 90%  Weight: 135 lb 8 oz (61.5 kg)  Height: 5' 3.5" (1.613 m)   Body mass index is 23.63 kg/m. Physical Exam Constitutional:      General: She is not in acute distress.    Appearance: She is well-developed. She is not diaphoretic.  HENT:     Head: Normocephalic and atraumatic.     Mouth/Throat:     Pharynx: No oropharyngeal exudate.  Eyes:     Conjunctiva/sclera: Conjunctivae normal.     Pupils: Pupils are equal, round, and reactive to light.  Cardiovascular:     Rate and Rhythm: Normal rate and regular rhythm.     Heart sounds: Normal heart sounds.  Pulmonary:     Effort: Pulmonary effort is normal.     Breath sounds: Normal breath sounds.  Abdominal:     General: Bowel sounds are normal.     Palpations: Abdomen is soft.     Tenderness: There is abdominal tenderness (generalized).  Musculoskeletal:     Cervical back: Normal range of motion and neck supple.     Right lower leg: No edema.     Left lower leg: No edema.  Skin:    General: Skin is warm and dry.  Neurological:     Mental Status: She is alert.  Psychiatric:        Mood and Affect: Mood normal.     Labs reviewed: Recent Labs    03/05/23 0000  NA 141  K 4.1  CL 104  CO2 30*  BUN 17  CREATININE 0.8  CALCIUM 8.7   Recent Labs    03/05/23 0000  ALBUMIN 3.6   No results for input(s): "WBC", "NEUTROABS", "HGB", "HCT", "MCV", "PLT" in the last 8760 hours. No results found for: "TSH" No results found for: "HGBA1C" Lab Results  Component Value Date   CHOL 175 03/05/2023   HDL 39 03/05/2023   LDLCALC 102 03/05/2023   TRIG 219 (A) 03/05/2023    Significant Diagnostic Results in last 30 days:  No results found.  Assessment/Plan 1. Severe late onset Alzheimer's dementia with mood disturbance (HCC) -Stable, no acute changes in cognitive or functional status, having some increase in behaviors, continue supportive  care.   2. Osteoarthritis of multiple joints, unspecified osteoarthritis type Continues on tylenol scheduled.  3. Anxiety Worsening anxiety, continues on zoloft, will add PRN xanax daily as needed   4. Benign essential hypertension -stable, not currently on medication  5. Decreased appetite -will increase Remeron to 15 mg by mouth qhs to help with mood and appetite.   6. Generalized abdominal tenderness Noted on exam Due to dementia she is a poor historian, eating well with good bms -will get cbc with diff, cmp, amylase and lipase for further evaluation.   Janene Harvey. Biagio Borg [facility] &  Adult Medicine [phone]

## 2023-03-20 LAB — BASIC METABOLIC PANEL
BUN: 30 — AB (ref 4–21)
CO2: 28 — AB (ref 13–22)
Chloride: 102 (ref 99–108)
Creatinine: 0.9 (ref 0.5–1.1)
Glucose: 95
Potassium: 4.5 mEq/L (ref 3.5–5.1)
Sodium: 139 (ref 137–147)

## 2023-03-20 LAB — CBC: RBC: 4.1 (ref 3.87–5.11)

## 2023-03-20 LAB — COMPREHENSIVE METABOLIC PANEL
Albumin: 3.5 (ref 3.5–5.0)
Calcium: 8.8 (ref 8.7–10.7)
Globulin: 3.2

## 2023-03-20 LAB — CBC AND DIFFERENTIAL
HCT: 38 (ref 36–46)
Hemoglobin: 12.4 (ref 12.0–16.0)
Neutrophils Absolute: 9008
Platelets: 159 10*3/uL (ref 150–400)
WBC: 14.6

## 2023-03-20 LAB — HEPATIC FUNCTION PANEL
ALT: 9 U/L (ref 7–35)
AST: 14 (ref 13–35)
Alkaline Phosphatase: 51 (ref 25–125)
Bilirubin, Total: 0.9

## 2023-05-07 ENCOUNTER — Encounter: Payer: Self-pay | Admitting: Student

## 2023-05-07 ENCOUNTER — Non-Acute Institutional Stay (SKILLED_NURSING_FACILITY): Payer: Medicare Other | Admitting: Student

## 2023-05-07 DIAGNOSIS — E785 Hyperlipidemia, unspecified: Secondary | ICD-10-CM | POA: Diagnosis not present

## 2023-05-07 DIAGNOSIS — I10 Essential (primary) hypertension: Secondary | ICD-10-CM | POA: Diagnosis not present

## 2023-05-07 DIAGNOSIS — F02C0 Dementia in other diseases classified elsewhere, severe, without behavioral disturbance, psychotic disturbance, mood disturbance, and anxiety: Secondary | ICD-10-CM

## 2023-05-07 DIAGNOSIS — Z66 Do not resuscitate: Secondary | ICD-10-CM

## 2023-05-07 DIAGNOSIS — G301 Alzheimer's disease with late onset: Secondary | ICD-10-CM | POA: Diagnosis not present

## 2023-05-07 DIAGNOSIS — F3342 Major depressive disorder, recurrent, in full remission: Secondary | ICD-10-CM | POA: Diagnosis not present

## 2023-05-07 DIAGNOSIS — F02C3 Dementia in other diseases classified elsewhere, severe, with mood disturbance: Secondary | ICD-10-CM

## 2023-05-07 NOTE — Progress Notes (Signed)
[facility] Room Number: 510 A Place of Service:  SNF [phone]) Provider:  Earnestine Mealing, MD  Patient Care Team: Earnestine Mealing, MD as PCP - General (Family Medicine)  Extended Emergency Contact Information Primary Emergency Contact: Alyssa Clark Address: [phone]          Alyss[place], [place] [address] Alyssa Clark of [place] Home Phone: [phone] Mobile Phone: [phone] Relation: None  Code Status:  DNR Goals of care: Advanced Directive information    05/07/2023    8:34 AM  Advanced Directives  Does Patient Have a Medical Advance Directive? Yes  Type of Estate agent of [place];Living will;Out of facility DNR (pink MOST or yellow form)  Does patient want to make changes to medical advance directive? No - Patient declined  Copy of Healthcare Power of Attorney in Chart? Yes - validated most recent copy scanned in chart (See row information)  Pre-existing out of facility DNR order (yellow form or pink MOST form) Yellow form placed in chart (order not valid for inpatient use)     Chief Complaint  Patient presents with  . Medical Management of Chronic Issues    Routine visit. Discuss need for AWV, td/tdap, shingrix, pneumonia vaccine, covid booster, and DEXA or post pone if patient is not a candidate.     HPI:  Pt is a 89 y.o. female seen today for medical management of chronic diseases.   Patient is alert sitting in the sun room. She states she is fine and happy to see a nice face. She denies pain in her chest, stomach, or shortness of breath. Cannot name where we are. Gives her birth day but no year. She thinks she is 89 years old.  History reviewed. No pertinent past medical history. History reviewed. No pertinent surgical history.  Allergies  Allergen Reactions  . Ambien [Zolpidem]   . Gentamicin Other (See Comments)  . Hydrocodone   . Penicillins   . Sulfa Antibiotics Other (See Comments)  . Trazodone And  Nefazodone   . Yellow Dyes (Non-Tartrazine)     Outpatient Encounter Medications as of 05/07/2023  Medication Sig  . acetaminophen (TYLENOL) 500 MG tablet Take 1,000 mg by mouth 3 (three) times daily.  Marland Kitchen alum & mag hydroxide-simeth (MAALOX PLUS) 400-400-40 MG/5ML suspension Give 2 Tbsp by mouth every 4 hours as needed for gas, indigestion, or upset stomach  Supervised self-administration Notify MD if no relief  . Emollient (AQUAPHOR OINTMENT BODY EX) Apply 1 Application topically every 6 (six) hours as needed (for dry skin/lips).  Marland Kitchen guaiFENesin (MUCUS & CHEST CONGESTION) 100 MG/5ML LIQD Take 10 mLs by mouth every 4 (four) hours as needed.  . Infant Care Products [facility]) OINT Apply topically as needed. Apply to groin, thigh and buttock every day and evening shift  . memantine (NAMENDA) 10 MG tablet Take 10 mg by mouth 2 (two) times daily.  . Menthol-Methyl Salicylate (BENGAY GREASELESS EX) Apply topically as needed. Apply to both knees  . mirtazapine (REMERON) 15 MG tablet Take 15 mg by mouth at bedtime.  Marland Kitchen nystatin powder Apply 1 Application topically 2 (two) times daily as needed. Apply to groin for yeast rash  . polyethylene glycol (MIRALAX / GLYCOLAX) 17 g packet Take 17 g by mouth every 12 (twelve) hours as needed for mild constipation, moderate constipation or severe constipation.  . Protein (PROSOURCE PO) Mix one packet in 8 oz of juice daily.  . sertraline (ZOLOFT) 50 MG tablet Take 50 mg  by mouth daily.  . Vitamin D, Ergocalciferol, 50000 units CAPS Take 1 capsule by mouth every 30 (thirty) days. Starting on the 2nd and ending on the 2nd every month for SUPPLEMENT  . [DISCONTINUED] acetaminophen (TYLENOL) 325 MG tablet Take 650 mg by mouth every 6 (six) hours as needed.  . [DISCONTINUED] acetaminophen (TYLENOL) 650 MG CR tablet Take 650 mg by mouth 3 (three) times daily.  . [DISCONTINUED] mirtazapine (REMERON) 7.5 MG tablet Take 7.5 mg by mouth at bedtime.   No  facility-administered encounter medications on file as of 05/07/2023.    Review of Systems  Immunization History  Administered Date(s) Administered  . Influenza-Unspecified 08/22/2020, 08/20/2021, 08/20/2022  . Moderna Covid-19 Vaccine Bivalent Booster 23yrs & up 11/15/2019, 12/13/2019  . Pneumococcal-Unspecified 02/16/2015  . Unspecified SARS-COV-2 Vaccination 09/15/2020, 03/23/2021, 07/27/2021, 04/02/2022   Pertinent  Health Maintenance Due  Topic Date Due  . DEXA SCAN  Never done  . INFLUENZA VACCINE  06/05/2023       No data to display         Functional Status Survey:    Vitals:   05/07/23 0830  BP: 127/65  Pulse: 73  Temp: 98.4 F (36.9 C)  Weight: 153 lb (69.4 kg)  Height: 5' 3.5" (1.613 m)   Body mass index is 26.68 kg/m. Physical Exam  Labs reviewed: Recent Labs    03/05/23 0000 03/20/23 0000  NA 141 139  K 4.1 4.5  CL 104 102  CO2 30* 28*  BUN 17 30*  CREATININE 0.8 0.9  CALCIUM 8.7 8.8   Recent Labs    03/05/23 0000 03/20/23 0000  AST  --  14  ALT  --  9  ALKPHOS  --  51  ALBUMIN 3.6 3.5   Recent Labs    03/20/23 0000  WBC 14.6  NEUTROABS 9,008.00  HGB 12.4  HCT 38  PLT 159   No results found for: "TSH" No results found for: "HGBA1C" Lab Results  Component Value Date   CHOL 175 03/05/2023   HDL 39 03/05/2023   LDLCALC 102 03/05/2023   TRIG 219 (A) 03/05/2023    Significant Diagnostic Results in last 30 days:  No results found.  Assessment/Plan There are no diagnoses linked to this encounter.   Family/ staff Communication: ***  Labs/tests ordered:  ***

## 2023-05-16 ENCOUNTER — Non-Acute Institutional Stay (SKILLED_NURSING_FACILITY): Payer: Medicare Other | Admitting: Student

## 2023-05-16 ENCOUNTER — Encounter: Payer: Self-pay | Admitting: Student

## 2023-05-16 DIAGNOSIS — L853 Xerosis cutis: Secondary | ICD-10-CM

## 2023-05-16 NOTE — Progress Notes (Signed)
Location:  Other [facility].  Nursing Home Room Number: [facility] 510A Place of Service:  SNF [phone]) Provider:  Earnestine Mealing, MD  Patient Care Team: Earnestine Mealing, MD as PCP - General [facility] Medicine)  Extended Emergency Contact Information Primary Emergency Contact: [facility] Address: [phone]          [name], [place] [address] [name] Home Phone: [phone] Mobile Phone: [phone] Relation: None  Code Status:  DNR Goals of care: Advanced Directive information    05/16/2023   11:39 AM  Advanced Directives  Does Patient Have a Medical Advance Directive? Yes  Type of Estate agent of [place];Out of facility DNR (pink MOST or yellow form);Living will  Does patient want to make changes to medical advance directive? No - Patient declined  Copy of Healthcare Power of Attorney in Chart? Yes - validated most recent copy scanned in chart (See row information)     Chief Complaint  Patient presents with   Acute Visit    Dry Skin.     HPI:  Pt is a 89 y.o. female seen today for an acute visit for Dry Skin  Nursing with concern of severely dry skin. Red area of right calf.   Smiles and says, "how are you?" And states she is fine without pain at this time.  History reviewed. No pertinent past medical history. History reviewed. No pertinent surgical history.  Allergies  Allergen Reactions   Ambien [Zolpidem]    Gentamicin Other (See Comments)   Hydrocodone    Penicillins    Sulfa Antibiotics Other (See Comments)   Trazodone And Nefazodone    Yellow Dyes (Non-Tartrazine)     Outpatient Encounter Medications as of 05/16/2023  Medication Sig   acetaminophen (TYLENOL) 500 MG tablet Take 1,000 mg by mouth 3 (three) times daily.   ALPRAZolam (XANAX) 0.25 MG tablet Take 0.25 mg by mouth. Every 24 hours as needed.   alum & mag hydroxide-simeth (MAALOX PLUS) 400-400-40 MG/5ML suspension Give 2 Tbsp by mouth every 4 hours  as needed for gas, indigestion, or upset stomach  Supervised self-administration Notify MD if no relief   Emollient (AQUAPHOR OINTMENT BODY EX) Apply 1 Application topically every 6 (six) hours as needed (for dry skin/lips).   guaiFENesin (MUCUS & CHEST CONGESTION) 100 MG/5ML LIQD Take 10 mLs by mouth every 4 (four) hours as needed.   Infant Care Products [facility]) OINT Apply topically as needed. Apply to groin, thigh and buttock every day and evening shift   memantine (NAMENDA) 10 MG tablet Take 10 mg by mouth 2 (two) times daily.   Menthol-Methyl Salicylate (BENGAY GREASELESS EX) Apply topically as needed. Apply to both knees   mirtazapine (REMERON) 15 MG tablet Take 15 mg by mouth at bedtime.   nystatin powder Apply 1 Application topically 2 (two) times daily as needed. Apply to groin for yeast rash   polyethylene glycol (MIRALAX / GLYCOLAX) 17 g packet Take 17 g by mouth every 12 (twelve) hours as needed for mild constipation, moderate constipation or severe constipation.   Protein (PROSOURCE PO) Mix one packet in 8 oz of juice daily.   sertraline (ZOLOFT) 50 MG tablet Take 50 mg by mouth daily.   Vitamin D, Ergocalciferol, 50000 units CAPS Take 1 capsule by mouth every 30 (thirty) days. Starting on the 2nd and ending on the 2nd every month for SUPPLEMENT   No facility-administered encounter medications on file as of 05/16/2023.    Review of Systems  Immunization History  Administered Date(s) Administered   Influenza-Unspecified 08/22/2020, 08/20/2021, 08/20/2022   Moderna Covid-19 Vaccine Bivalent Booster 71yrs & up 11/15/2019, 12/13/2019   Pneumococcal-Unspecified 02/16/2015   Unspecified SARS-COV-2 Vaccination 09/15/2020, 03/23/2021, 07/27/2021, 04/02/2022   Pertinent  Health Maintenance Due  Topic Date Due   DEXA SCAN  Never done   INFLUENZA VACCINE  06/05/2023       No data to display         Functional Status Survey:    Vitals:   05/16/23 1134  BP: 134/79   Pulse: 80  Resp: 12  Temp: 97.9 F (36.6 C)  SpO2: 93%  Weight: 141 lb 6.4 oz (64.1 kg)  Height: 5' 3.5" (1.613 m)   Body mass index is 24.65 kg/m. Physical Exam Skin:    Comments: Bilateral legs with xerosis. Right calf with 3x2cm erythematous patch. Extensive scale over entire leg  Neurological:     Mental Status: She is alert.     Labs reviewed: Recent Labs    03/05/23 0000 03/20/23 0000  NA 141 139  K 4.1 4.5  CL 104 102  CO2 30* 28*  BUN 17 30*  CREATININE 0.8 0.9  CALCIUM 8.7 8.8   Recent Labs    03/05/23 0000 03/20/23 0000  AST  --  14  ALT  --  9  ALKPHOS  --  51  ALBUMIN 3.6 3.5   Recent Labs    03/20/23 0000  WBC 14.6  NEUTROABS 9,008.00  HGB 12.4  HCT 38  PLT 159   No results found for: "TSH" No results found for: "HGBA1C" Lab Results  Component Value Date   CHOL 175 03/05/2023   HDL 39 03/05/2023   LDLCALC 102 03/05/2023   TRIG 219 (A) 03/05/2023    Significant Diagnostic Results in last 30 days:  No results found.  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 Discussed improtance of hydration. Cereva and aquaphor on shower days. Cerave daily. Plan to follow up closely to determine need of further treatment for area on leg. Difficult to differentiate overall scale from the area on the leg. Some notable generalized skin thinning.   Family/ staff Communication: nursing  Labs/tests ordered:   none

## 2023-06-12 ENCOUNTER — Encounter: Payer: Self-pay | Admitting: Nurse Practitioner

## 2023-06-12 ENCOUNTER — Non-Acute Institutional Stay (INDEPENDENT_AMBULATORY_CARE_PROVIDER_SITE_OTHER): Payer: Medicare Other | Admitting: Nurse Practitioner

## 2023-06-12 DIAGNOSIS — Z0000 Encounter for general adult medical examination without abnormal findings: Secondary | ICD-10-CM | POA: Diagnosis not present

## 2023-06-12 NOTE — Patient Instructions (Signed)
  Alyssa Clark , Thank you for taking time to come for your Medicare Wellness Visit. I appreciate your ongoing commitment to your health goals. Please review the following plan we discussed and let me know if I can assist you in the future.   These are the goals we discussed:  Goals   None     This is a list of the screening recommended for you and due dates:  Health Maintenance  Topic Date Due   Pneumonia Vaccine (1 of 1 - PCV) 03/27/1984   COVID-19 Vaccine (7 - 2023-24 season) 07/05/2022   Flu Shot  06/05/2023   Zoster (Shingles) Vaccine (1 of 2) 06/11/2024*   Medicare Annual Wellness Visit  06/11/2024   HPV Vaccine  Aged Out   DTaP/Tdap/Td vaccine  Discontinued   DEXA scan (bone density measurement)  Discontinued  *Topic was postponed. The date shown is not the original due date.

## 2023-06-12 NOTE — Progress Notes (Signed)
Subjective:   Alyssa Clark is a 89 y.o. female who presents for Medicare Annual (Subsequent) preventive examination.  Visit Complete: In person [facility]   Review of Systems     Cardiac Risk Factors include: sedentary lifestyle;advanced age (>5men, >11 women)     Objective:    Today's Vitals   06/12/23 1500  BP: 134/79  Pulse: 80  Resp: 12  Temp: (!) 97.5 F (36.4 C)  SpO2: 93%  Weight: 141 lb 6.4 oz (64.1 kg)  Height: 5' 3.5" (1.613 m)   Body mass index is 24.65 kg/m.     06/12/2023    3:07 PM 05/16/2023   11:39 AM 05/07/2023    8:34 AM 03/18/2023    2:01 PM 01/01/2023    8:39 AM 11/01/2022    9:42 AM 09/03/2022    2:26 PM  Advanced Directives  Does Patient Have a Medical Advance Directive? Yes Yes Yes Yes Yes Yes Yes  Type of Estate agent of [place];Out of facility DNR (pink MOST or yellow form);Living will Healthcare Power of [place];Out of facility DNR (pink MOST or yellow form);Living will Healthcare Power of [place];Living will;Out of facility DNR (pink MOST or yellow form) Healthcare Power of [place];Living will;Out of facility DNR (pink MOST or yellow form) Healthcare Power of [place];Out of facility DNR (pink MOST or yellow form);Living will Out of facility DNR (pink MOST or yellow form) Out of facility DNR (pink MOST or yellow form)  Does patient want to make changes to medical advance directive? No - Patient declined No - Patient declined No - Patient declined No - Patient declined No - Patient declined No - Patient declined No - Patient declined  Copy of Healthcare Power of Attorney in Chart? Yes - validated most recent copy scanned in chart (See row information) Yes - validated most recent copy scanned in chart (See row information) Yes - validated most recent copy scanned in chart (See row information) Yes - validated most recent copy scanned in chart (See row information) No - copy requested    Pre-existing out of facility DNR  order (yellow form or pink MOST form)   Yellow form placed in chart (order not valid for inpatient use)    Yellow form placed in chart (order not valid for inpatient use)    Current Medications (verified) Outpatient Encounter Medications as of 06/12/2023  Medication Sig   acetaminophen (TYLENOL) 500 MG tablet Take 1,000 mg by mouth 3 (three) times daily.   ALPRAZolam (XANAX) 0.25 MG tablet Take 0.25 mg by mouth. Every 24 hours as needed.   alum & mag hydroxide-simeth (MAALOX PLUS) 400-400-40 MG/5ML suspension Give 2 Tbsp by mouth every 4 hours as needed for gas, indigestion, or upset stomach  Supervised self-administration Notify MD if no relief   Emollient (AQUAPHOR OINTMENT BODY EX) Apply 1 Application topically every 6 (six) hours as needed (for dry skin/lips).   guaiFENesin (MUCUS & CHEST CONGESTION) 100 MG/5ML LIQD Take 10 mLs by mouth every 4 (four) hours as needed.   Infant Care Products [facility]) OINT Apply topically as needed. Apply to groin, thigh and buttock every day and evening shift   memantine (NAMENDA) 10 MG tablet Take 10 mg by mouth 2 (two) times daily.   Menthol-Methyl Salicylate (BENGAY GREASELESS EX) Apply topically as needed. Apply to both knees   mirtazapine (REMERON) 15 MG tablet Take 15 mg by mouth at bedtime.   nystatin powder Apply 1 Application topically 2 (two) times daily as needed. Apply  to groin for yeast rash   polyethylene glycol (MIRALAX / GLYCOLAX) 17 g packet Take 17 g by mouth every 12 (twelve) hours as needed for mild constipation, moderate constipation or severe constipation.   Protein (PROSOURCE PO) Mix one packet in 8 oz of juice daily.   sertraline (ZOLOFT) 50 MG tablet Take 50 mg by mouth daily.   Vitamin D, Ergocalciferol, 50000 units CAPS Take 1 capsule by mouth every 30 (thirty) days. Starting on the 2nd and ending on the 2nd every month for SUPPLEMENT   No facility-administered encounter medications on file as of 06/12/2023.    Allergies  (verified) Ambien [zolpidem], Gentamicin, Hydrocodone, Penicillins, Sulfa antibiotics, Trazodone and nefazodone, and Yellow dyes (non-tartrazine)   History: History reviewed. No pertinent past medical history. History reviewed. No pertinent surgical history. History reviewed. No pertinent family history. Social History   Socioeconomic History   Marital status: Widowed    Spouse name: Not on file   Number of children: Not on file   Years of education: Not on file   Highest education level: Not on file  Occupational History   Not on file  Tobacco Use   Smoking status: Unknown   Smokeless tobacco: Not on file  Vaping Use   Vaping status: Never Used  Substance and Sexual Activity   Alcohol use: Never   Drug use: Never   Sexual activity: Not Currently  Other Topics Concern   Not on file  Social History Narrative   Not on file   Social Determinants of Health   Financial Resource Strain: Not on file  Food Insecurity: Not on file  Transportation Needs: Not on file  Physical Activity: Not on file  Stress: Not on file  Social Connections: Not on file    Tobacco Counseling Counseling given: Not Answered   Clinical Intake:  Pre-visit preparation completed: Yes  Pain : No/denies pain     BMI - recorded: 24 Nutritional Status: BMI of 19-24  Normal Nutritional Risks: None Diabetes: No  How often do you need to have someone help you when you read instructions, pamphlets, or other written materials from your doctor or pharmacy?: 5 - Always         Activities of Daily Living    06/12/2023    2:17 PM  In your present state of health, do you have any difficulty performing the following activities:  Hearing? 1  Vision? 1  Difficulty concentrating or making decisions? 1  Walking or climbing stairs? 1  Dressing or bathing? 1  Doing errands, shopping? 1  Preparing Food and eating ? Y  Using the Toilet? Y  In the past six months, have you accidently leaked urine? Y   Do you have problems with loss of bowel control? Y  Managing your Medications? Y  Managing your Finances? Y  Housekeeping or managing your Housekeeping? Y    Patient Care Team: Earnestine Mealing, MD as PCP - General (Family Medicine)  Indicate any recent Medical Services you may have received from other than [facility] providers in the past year (date may be approximate).     Assessment:   This is a routine wellness examination for [place].  Hearing/Vision screen No results found.  Dietary issues and exercise activities discussed:     Goals Addressed   None    Depression Screen     No data to display          Fall Risk    06/12/2023    2:19 PM  Fall Risk   Falls in the past year? 0    MEDICARE RISK AT HOME:   TIMED UP AND GO:  Was the test performed?  No    Cognitive Function:        Immunizations Immunization History  Administered Date(s) Administered   Covid-19, Mrna,Vaccine(Spikevax)27yrs and older 02/11/2023   Influenza-Unspecified 08/22/2020, 08/20/2021, 08/20/2022   Moderna Covid-19 Vaccine Bivalent Booster 53yrs & up 11/15/2019, 12/13/2019   Pneumococcal-Unspecified 02/16/2015   Unspecified SARS-COV-2 Vaccination 09/15/2020, 03/23/2021, 07/27/2021, 04/02/2022    TDAP status: Due, Education has been provided regarding the importance of this vaccine. Advised may receive this vaccine at local pharmacy or Health Dept. Aware to provide a copy of the vaccination record if obtained from local pharmacy or Health Dept. Verbalized acceptance and understanding.  Flu Vaccine status: Due, Education has been provided regarding the importance of this vaccine. Advised may receive this vaccine at local pharmacy or Health Dept. Aware to provide a copy of the vaccination record if obtained from local pharmacy or Health Dept. Verbalized acceptance and understanding.  Pneumococcal vaccine status: Due, Education has been provided regarding the importance of this vaccine.  Advised may receive this vaccine at local pharmacy or Health Dept. Aware to provide a copy of the vaccination record if obtained from local pharmacy or Health Dept. Verbalized acceptance and understanding.  Covid-19 vaccine status: Information provided on how to obtain vaccines.   Qualifies for Shingles Vaccine? Yes   Zostavax completed No   Shingrix Completed?: No.    Education has been provided regarding the importance of this vaccine. Patient has been advised to call insurance company to determine out of pocket expense if they have not yet received this vaccine. Advised may also receive vaccine at local pharmacy or Health Dept. Verbalized acceptance and understanding.  Screening Tests Health Maintenance  Topic Date Due   Pneumonia Vaccine 71+ Years old (1 of 1 - PCV) 03/27/1984   COVID-19 Vaccine (8 - 2023-24 season) 04/08/2023   INFLUENZA VACCINE  06/05/2023   Zoster Vaccines- Shingrix (1 of 2) 06/11/2024 (Originally 03/27/1936)   Medicare Annual Wellness (AWV)  06/11/2024   HPV VACCINES  Aged Out   DTaP/Tdap/Td  Discontinued   DEXA SCAN  Discontinued    Health Maintenance  Health Maintenance Due  Topic Date Due   Pneumonia Vaccine 1+ Years old (1 of 1 - PCV) 03/27/1984   COVID-19 Vaccine (8 - 2023-24 season) 04/08/2023   INFLUENZA VACCINE  06/05/2023    Colorectal cancer screening: No longer required.   Not a candidate for bone density Lung Cancer Screening: (Low Dose CT Chest recommended if Age 4-80 years, 20 pack-year currently smoking OR have quit w/in 15years.) does not qualify.   Lung Cancer Screening Referral: na  Additional Screening:  Hepatitis C Screening: does not qualify; Completed na  Vision Screening: Recommended annual ophthalmology exams for early detection of glaucoma and other disorders of the eye. Is the patient up to date with their annual eye exam?  No  Due to dementia     Dental Screening: Recommended annual dental exams for proper oral  hygiene  Community Resource Referral / Chronic Care Management: CRR required this visit?  No   CCM required this visit?  No     Plan:     I have personally reviewed and noted the following in the patient's chart:   Medical and social history Use of alcohol, tobacco or illicit drugs  Current medications and supplements including opioid prescriptions. Patient is not  currently taking opioid prescriptions. Functional ability and status Nutritional status Physical activity Advanced directives List of other physicians Hospitalizations, surgeries, and ER visits in previous 12 months Vitals Screenings to include cognitive, depression, and falls Referrals and appointments  In addition, I have reviewed and discussed with patient certain preventive protocols, quality metrics, and best practice recommendations. A written personalized care plan for preventive services as well as general preventive health recommendations were provided to patient.     Sharon Seller, NP   06/12/2023

## 2023-07-03 ENCOUNTER — Non-Acute Institutional Stay (SKILLED_NURSING_FACILITY): Payer: Medicare Other | Admitting: Nurse Practitioner

## 2023-07-03 ENCOUNTER — Encounter: Payer: Self-pay | Admitting: Nurse Practitioner

## 2023-07-03 DIAGNOSIS — F3342 Major depressive disorder, recurrent, in full remission: Secondary | ICD-10-CM | POA: Diagnosis not present

## 2023-07-03 DIAGNOSIS — E559 Vitamin D deficiency, unspecified: Secondary | ICD-10-CM

## 2023-07-03 DIAGNOSIS — G301 Alzheimer's disease with late onset: Secondary | ICD-10-CM | POA: Diagnosis not present

## 2023-07-03 DIAGNOSIS — M159 Polyosteoarthritis, unspecified: Secondary | ICD-10-CM | POA: Diagnosis not present

## 2023-07-03 DIAGNOSIS — F02C0 Dementia in other diseases classified elsewhere, severe, without behavioral disturbance, psychotic disturbance, mood disturbance, and anxiety: Secondary | ICD-10-CM

## 2023-07-03 NOTE — Progress Notes (Signed)
Location[place]  [place].  Nursing Home Room Number: [facility] 510A Place of Service:  SNF [phone]) Abbey Chatters, NP  PCP: Earnestine Mealing, MD  Patient Care Team: Earnestine Mealing, MD as PCP - General [facility] Medicine)  Extended Emergency Contact Information Primary Emergency Contact: [facility] Address: [phone]          [name], [place] [address] [name] Home Phone: [phone] Mobile Phone: [phone] Relation: None  Goals of care: Advanced Directive information    07/03/2023   12:36 PM  Advanced Directives  Does Patient Have a Medical Advance Directive? Yes  Type of Estate agent of [place];Out of facility DNR (pink MOST or yellow form);Living will  Does patient want to make changes to medical advance directive? No - Patient declined  Copy of Healthcare Power of Attorney in Chart? Yes - validated most recent copy scanned in chart (See row information)     Chief Complaint  Patient presents with   Medical Management of Chronic Issues    Medical Management of Chronic Issues.     HPI:  Pt is a 89 y.o. female seen today for medical management of chronic disease.  Pt has been doing well in [facility].  She gets her hair done every Thursday and enjoys this.  She reports she has no pain, anxiety or depression.  Weights have been stable.  Staff has no concerns at this time.    History reviewed. No pertinent past medical history. History reviewed. No pertinent surgical history.  Allergies  Allergen Reactions   Ambien [Zolpidem]    Gentamicin Other (See Comments)   Hydrocodone    Penicillins    Sulfa Antibiotics Other (See Comments)   Trazodone And Nefazodone    Yellow Dyes (Non-Tartrazine)     Outpatient Encounter Medications as of 07/03/2023  Medication Sig   acetaminophen (TYLENOL) 500 MG tablet Take 1,000 mg by mouth 3 (three) times daily.   alum & mag hydroxide-simeth (MAALOX PLUS) 400-400-40 MG/5ML  suspension Give 2 Tbsp by mouth every 4 hours as needed for gas, indigestion, or upset stomach  Supervised self-administration Notify MD if no relief   Emollient (AQUAPHOR OINTMENT BODY EX) Apply 1 Application topically every 6 (six) hours as needed (for dry skin/lips).   guaiFENesin (MUCUS & CHEST CONGESTION) 100 MG/5ML LIQD Take 10 mLs by mouth every 4 (four) hours as needed.   Infant Care Products [facility]) OINT Apply topically as needed. Apply to groin, thigh and buttock every day and evening shift   memantine (NAMENDA) 10 MG tablet Take 10 mg by mouth 2 (two) times daily.   Menthol-Methyl Salicylate (BENGAY GREASELESS EX) Apply topically as needed. Apply to both knees   mirtazapine (REMERON) 15 MG tablet Take 15 mg by mouth at bedtime.   nystatin powder Apply 1 Application topically 2 (two) times daily as needed. Apply to groin for yeast rash   polyethylene glycol (MIRALAX / GLYCOLAX) 17 g packet Take 17 g by mouth every 12 (twelve) hours as needed for mild constipation, moderate constipation or severe constipation.   Protein (PROSOURCE PO) Mix one packet in 8 oz of juice daily.   sertraline (ZOLOFT) 50 MG tablet Take 50 mg by mouth daily.   Vitamin D, Ergocalciferol, 50000 units CAPS Take 1 capsule by mouth every 30 (thirty) days. Starting on the 2nd and ending on the 2nd every month for SUPPLEMENT   [DISCONTINUED] ALPRAZolam (XANAX) 0.25 MG tablet Take 0.25 mg by mouth. Every 24  hours as needed.   No facility-administered encounter medications on file as of 07/03/2023.    Review of Systems  Unable to perform ROS: Dementia     Immunization History  Administered Date(s) Administered   Covid-19, Mrna,Vaccine(Spikevax)70yrs and older 02/11/2023   Influenza-Unspecified 08/22/2020, 08/20/2021, 08/20/2022   Moderna Covid-19 Vaccine Bivalent Booster 59yrs & up 11/15/2019, 12/13/2019   Pneumococcal-Unspecified 02/16/2015   Unspecified SARS-COV-2 Vaccination 09/15/2020, 03/23/2021,  07/27/2021, 04/02/2022   Pertinent  Health Maintenance Due  Topic Date Due   INFLUENZA VACCINE  06/05/2023   DEXA SCAN  Discontinued      06/12/2023    2:19 PM  Fall Risk  Falls in the past year? 0   Functional Status Survey:    Vitals:   07/03/23 1231  BP: (!) 106/54  Pulse: 89  Resp: 20  Temp: 97.9 F (36.6 C)  SpO2: 92%  Weight: 141 lb 6.4 oz (64.1 kg)  Height: 5' 3.5" (1.613 m)   Body mass index is 24.65 kg/m. Wt Readings from Last 3 Encounters:  07/03/23 141 lb 6.4 oz (64.1 kg)  06/12/23 141 lb 6.4 oz (64.1 kg)  05/16/23 141 lb 6.4 oz (64.1 kg)    Physical Exam Constitutional:      General: She is not in acute distress.    Appearance: She is well-developed. She is not diaphoretic.  HENT:     Head: Normocephalic and atraumatic.     Mouth/Throat:     Pharynx: No oropharyngeal exudate.  Eyes:     Conjunctiva/sclera: Conjunctivae normal.     Pupils: Pupils are equal, round, and reactive to light.  Cardiovascular:     Rate and Rhythm: Normal rate and regular rhythm.     Heart sounds: Normal heart sounds.  Pulmonary:     Effort: Pulmonary effort is normal.     Breath sounds: Normal breath sounds.  Abdominal:     General: Bowel sounds are normal.     Palpations: Abdomen is soft.  Musculoskeletal:     Cervical back: Normal range of motion and neck supple.     Right lower leg: No edema.     Left lower leg: No edema.  Skin:    General: Skin is warm and dry.  Neurological:     Mental Status: She is alert. She is disoriented.     Motor: Weakness present.     Gait: Gait abnormal.  Psychiatric:        Mood and Affect: Mood normal.     Labs reviewed: Recent Labs    03/05/23 0000 03/20/23 0000  NA 141 139  K 4.1 4.5  CL 104 102  CO2 30* 28*  BUN 17 30*  CREATININE 0.8 0.9  CALCIUM 8.7 8.8   Recent Labs    03/05/23 0000 03/20/23 0000  AST  --  14  ALT  --  9  ALKPHOS  --  51  ALBUMIN 3.6 3.5   Recent Labs    03/20/23 0000  WBC 14.6   NEUTROABS 9,008.00  HGB 12.4  HCT 38  PLT 159   No results found for: "TSH" No results found for: "HGBA1C" Lab Results  Component Value Date   CHOL 175 03/05/2023   HDL 39 03/05/2023   LDLCALC 102 03/05/2023   TRIG 219 (A) 03/05/2023    Significant Diagnostic Results in last 30 days:  No results found.  Assessment/Plan 1. Vitamin D deficiency -continues on supplement  2. Recurrent major depressive disorder, in full remission (HCC) -controlled on  zoloft.   3. Severe late onset Alzheimer's dementia without behavioral disturbance, psychotic disturbance, mood disturbance, or anxiety (HCC) Stable, no acute changes in cognitive or functional status, continue supportive care.   4. Osteoarthritis of multiple joints, unspecified osteoarthritis type Stable, continues on scheduled tylenol.    Janene Harvey. Biagio Borg [facility] [phone]

## 2023-07-16 ENCOUNTER — Non-Acute Institutional Stay (SKILLED_NURSING_FACILITY): Payer: Medicare Other | Admitting: Student

## 2023-07-16 ENCOUNTER — Encounter: Payer: Self-pay | Admitting: Student

## 2023-07-16 DIAGNOSIS — F3342 Major depressive disorder, recurrent, in full remission: Secondary | ICD-10-CM

## 2023-07-16 DIAGNOSIS — I10 Essential (primary) hypertension: Secondary | ICD-10-CM

## 2023-07-16 DIAGNOSIS — G301 Alzheimer's disease with late onset: Secondary | ICD-10-CM | POA: Diagnosis not present

## 2023-07-16 DIAGNOSIS — L8992 Pressure ulcer of unspecified site, stage 2: Secondary | ICD-10-CM

## 2023-07-16 DIAGNOSIS — R630 Anorexia: Secondary | ICD-10-CM | POA: Diagnosis not present

## 2023-07-16 DIAGNOSIS — L97501 Non-pressure chronic ulcer of other part of unspecified foot limited to breakdown of skin: Secondary | ICD-10-CM

## 2023-07-16 DIAGNOSIS — F02C0 Dementia in other diseases classified elsewhere, severe, without behavioral disturbance, psychotic disturbance, mood disturbance, and anxiety: Secondary | ICD-10-CM

## 2023-07-16 NOTE — Progress Notes (Signed)
Location:  [place].  Nursing Home Room Number: [facility] [address]A Place of Service:  SNF [phone]) Provider:  Earnestine Mealing, MD  Patient Care Team: Earnestine Mealing, MD as PCP - General [facility] Medicine)  Extended Emergency Contact Information Primary Emergency Contact: [facility] Address: [phone]          [name], [place] [address] [name] Home Phone: [phone] Mobile Phone: [phone] Relation: None  Code Status:  DNR Goals of care: Advanced Directive information    07/16/2023    2:30 PM  Advanced Directives  Does Patient Have a Medical Advance Directive? Yes  Type of Estate agent of [place];Out of facility DNR (pink MOST or yellow form);Living will  Does patient want to make changes to medical advance directive? No - Patient declined  Copy of Healthcare Power of Attorney in Chart? Yes - validated most recent copy scanned in chart (See row information)     Chief Complaint  Patient presents with   Acute Visit    Wounds.     HPI:  Pt is a 89 y.o. female seen today for an acute visit for Wounds and GOC conversation  Patient is alert, however, she is not oriented to time or place. She talks about a son named Vonna Kotyk. 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 She doesn't want to tell her birthday or age because, "You can't tell too much when you just meet someone."  Contacted son along with nurse supervisor due to concern that patient is having signs of decline. Over the last year 10 lb weight loss, sacral wound now wounds of the feet. Signs of skin (organ) failure, and poor nutrition as her activity has not changed. Discussed benefits of hospice. Answered questions regarding the services. Patient's anxiety is in a good place, do not want any changes that could impact her mood.    History reviewed. No pertinent past medical history. History reviewed. No pertinent surgical history.  Allergies   Allergen Reactions   Ambien [Zolpidem]    Gentamicin Other (See Comments)   Hydrocodone    Penicillins    Sulfa Antibiotics Other (See Comments)   Trazodone And Nefazodone    Yellow Dyes (Non-Tartrazine)     Outpatient Encounter Medications as of 07/16/2023  Medication Sig   acetaminophen (TYLENOL) 500 MG tablet Take 1,000 mg by mouth 3 (three) times daily.   alum & mag hydroxide-simeth (MAALOX PLUS) 400-400-40 MG/5ML suspension Give 2 Tbsp by mouth every 4 hours as needed for gas, indigestion, or upset stomach  Supervised self-administration Notify MD if no relief   Emollient (AQUAPHOR OINTMENT BODY EX) Apply 1 Application topically every 6 (six) hours as needed (for dry skin/lips).   guaiFENesin (MUCUS & CHEST CONGESTION) 100 MG/5ML LIQD Take 10 mLs by mouth every 4 (four) hours as needed.   Infant Care Products [facility]) OINT Apply topically as needed. Apply to groin, thigh and buttock every day and evening shift   memantine (NAMENDA) 10 MG tablet Take 10 mg by mouth 2 (two) times daily.   Menthol-Methyl Salicylate (BENGAY GREASELESS EX) Apply topically as needed. Apply to both knees   mirtazapine (REMERON) 15 MG tablet Take 15 mg by mouth at bedtime.   nystatin powder Apply 1 Application topically 2 (two) times daily as needed. Apply to groin for yeast rash   polyethylene glycol (MIRALAX / GLYCOLAX) 17 g packet Take 17 g by mouth every 12 (twelve) hours as needed  for mild constipation, moderate constipation or severe constipation.   Protein (PROSOURCE PO) Mix one packet in 8 oz of juice daily.   sertraline (ZOLOFT) 50 MG tablet Take 50 mg by mouth daily.   Vitamin D, Ergocalciferol, 50000 units CAPS Take 1 capsule by mouth every 30 (thirty) days. Starting on the 2nd and ending on the 2nd every month for SUPPLEMENT   No facility-administered encounter medications on file as of 07/16/2023.    Review of Systems  Immunization History  Administered Date(s) Administered   Covid-19,  Mrna,Vaccine(Spikevax)81yrs and older 02/11/2023   Influenza-Unspecified 08/22/2020, 08/20/2021, 08/20/2022   Moderna Covid-19 Vaccine Bivalent Booster 53yrs & up 11/15/2019, 12/13/2019   Pneumococcal-Unspecified 02/16/2015   Unspecified SARS-COV-2 Vaccination 09/15/2020, 03/23/2021, 07/27/2021, 04/02/2022   Pertinent  Health Maintenance Due  Topic Date Due   INFLUENZA VACCINE  06/05/2023   DEXA SCAN  Discontinued      06/12/2023    2:19 PM  Fall Risk  Falls in the past year? 0   Functional Status Survey:    Vitals:   07/16/23 1426  BP: (!) 106/54  Pulse: 89  Resp: 20  Temp: 97.9 F (36.6 C)  SpO2: 94%  Weight: 136 lb 6.4 oz (61.9 kg)  Height: 5' 3.5" (1.613 m)   Body mass index is 23.78 kg/m. Physical Exam Cardiovascular:     Rate and Rhythm: Normal rate.     Comments: Right foot w/o palpable pulse. Left DP pulse is faint Skin:    Comments: Left toes with ulceration of bony prominence on all 5 toes. Bilateral great toe with non-blanching erythematous skin bilaterally. Right 5th toe with ulceration present   Neurological:     Mental Status: She is alert. She is disoriented.     Labs reviewed: Recent Labs    03/05/23 0000 03/20/23 0000  NA 141 139  K 4.1 4.5  CL 104 102  CO2 30* 28*  BUN 17 30*  CREATININE 0.8 0.9  CALCIUM 8.7 8.8   Recent Labs    03/05/23 0000 03/20/23 0000  AST  --  14  ALT  --  9  ALKPHOS  --  51  ALBUMIN 3.6 3.5   Recent Labs    03/20/23 0000  WBC 14.6  NEUTROABS 9,008.00  HGB 12.4  HCT 38  PLT 159   No results found for: "TSH" No results found for: "HGBA1C" Lab Results  Component Value Date   CHOL 175 03/05/2023   HDL 39 03/05/2023   LDLCALC 102 03/05/2023   TRIG 219 (A) 03/05/2023    Significant Diagnostic Results in last 30 days:  No results found.  Assessment/Plan Severe late onset Alzheimer's dementia without behavioral disturbance, psychotic disturbance, mood disturbance, or anxiety (HCC) - Plan:  Ambulatory referral to Hospice  Recurrent major depressive disorder, in full remission (HCC)  Decreased appetite - Plan: Ambulatory referral to Hospice  Benign essential hypertension  Healing pressure injury, stage 2 (HCC) - Plan: Ambulatory referral to Hospice  Skin ulcer of toe, limited to breakdown of skin, unspecified laterality (HCC) Patient with history of dementia which has shown signs of decline. 10 lb weight loss in the last year, decreased appetite. Periodically declines food. Mood recently improved. BP low without medications at this time which could be reflective of poor PO intake/hydration. Most recent labs with elevated BUN and CO2 which can also reflect dehydration. GFR within normal range. Patient with new wounds of buttocks as well as bilateral feet concerning for signs of decline and skin  organ failure. Will plan to cleanse with betadine daily and avoid pressure. Wounds are likely multifactorial including poor circulation as well as low mobility. Plan to have a blanket tent while in bed. Will consult hospice at this time for support in care of the patient and continue goal of comfort-focused care.   Family/ staff Communication: nursing, children  Labs/tests ordered:  none

## 2023-08-11 ENCOUNTER — Encounter: Payer: Self-pay | Admitting: Student

## 2023-08-11 ENCOUNTER — Non-Acute Institutional Stay (SKILLED_NURSING_FACILITY): Payer: Medicare Other | Admitting: Student

## 2023-08-11 DIAGNOSIS — G301 Alzheimer's disease with late onset: Secondary | ICD-10-CM | POA: Diagnosis not present

## 2023-08-11 DIAGNOSIS — L97501 Non-pressure chronic ulcer of other part of unspecified foot limited to breakdown of skin: Secondary | ICD-10-CM | POA: Diagnosis not present

## 2023-08-11 DIAGNOSIS — L8992 Pressure ulcer of unspecified site, stage 2: Secondary | ICD-10-CM

## 2023-08-11 DIAGNOSIS — F3342 Major depressive disorder, recurrent, in full remission: Secondary | ICD-10-CM

## 2023-08-11 DIAGNOSIS — F02C0 Dementia in other diseases classified elsewhere, severe, without behavioral disturbance, psychotic disturbance, mood disturbance, and anxiety: Secondary | ICD-10-CM

## 2023-08-11 DIAGNOSIS — I10 Essential (primary) hypertension: Secondary | ICD-10-CM | POA: Diagnosis not present

## 2023-08-11 NOTE — Progress Notes (Signed)
Location:  [place].  Nursing Home Room Number: [facility] 510A Place of Service:  SNF [phone]) Provider:  Earnestine Mealing, MD  Patient Care Team: Earnestine Mealing, MD as PCP - General [facility] Medicine)  Extended Emergency Contact Information Primary Emergency Contact: [facility] Address: [phone]          [name], [place] [address] [name] Home Phone: [phone] Mobile Phone: [phone] Relation: None  Code Status:  DNR Goals of care: Advanced Directive information    07/16/2023    2:30 PM  Advanced Directives  Does Patient Have a Medical Advance Directive? Yes  Type of Estate agent of [place];Out of facility DNR (pink MOST or yellow form);Living will  Does patient want to make changes to medical advance directive? No - Patient declined  Copy of Healthcare Power of Attorney in Chart? Yes - validated most recent copy scanned in chart (See row information)     Chief Complaint  Patient presents with   Medical Management of Chronic Issues    Medical Management of Chronic Issues.     HPI:  Pt is a 89 y.o. female seen today for medical management of chronic diseases.    Patient states she is doing well.  She gives her name and date of birth, however does not answer where we are at this time.  She closes her eyes and stopped answering questions.  Nursing without acute concerns at this time.  Of note hospice was discontinued due to noncoverage of Namenda.   History reviewed. No pertinent past medical history. History reviewed. No pertinent surgical history.  Allergies  Allergen Reactions   Ambien [Zolpidem]    Gentamicin Other (See Comments)   Hydrocodone    Penicillins    Sulfa Antibiotics Other (See Comments)   Trazodone And Nefazodone    Yellow Dyes (Non-Tartrazine)     Outpatient Encounter Medications as of 08/11/2023  Medication Sig   acetaminophen (TYLENOL) 500 MG tablet Take 1,000 mg by mouth 3 (three)  times daily.   Emollient (AQUAPHOR OINTMENT BODY EX) Apply 1 Application topically every 6 (six) hours as needed (for dry skin/lips).   guaiFENesin (MUCUS & CHEST CONGESTION) 100 MG/5ML LIQD Take 10 mLs by mouth every 4 (four) hours as needed.   Infant Care Products [facility]) OINT Apply topically as needed. Apply to groin, thigh and buttock every day and evening shift   memantine (NAMENDA) 10 MG tablet Take 10 mg by mouth 2 (two) times daily.   Menthol-Methyl Salicylate (BENGAY GREASELESS EX) Apply topically as needed. Apply to both knees   mirtazapine (REMERON) 15 MG tablet Take 15 mg by mouth at bedtime.   nystatin powder Apply 1 Application topically 2 (two) times daily as needed. Apply to groin for yeast rash   polyethylene glycol (MIRALAX / GLYCOLAX) 17 g packet Take 17 g by mouth every 12 (twelve) hours as needed for mild constipation, moderate constipation or severe constipation.   Protein (PROSOURCE PO) Mix one packet in 8 oz of juice daily.   sertraline (ZOLOFT) 50 MG tablet Take 50 mg by mouth daily.   Vitamin D, Ergocalciferol, 50000 units CAPS Take 1 capsule by mouth every 30 (thirty) days. Starting on the 2nd and ending on the 2nd every month for SUPPLEMENT   [DISCONTINUED] alum & mag hydroxide-simeth (MAALOX PLUS) 400-400-40 MG/5ML suspension Give 2 Tbsp by mouth every 4 hours as needed for gas, indigestion, or upset stomach  Supervised self-administration Notify MD if no relief  No facility-administered encounter medications on file as of 08/11/2023.    Review of Systems  Immunization History  Administered Date(s) Administered   Influenza-Unspecified 08/22/2020, 08/20/2021, 08/20/2022   Moderna Covid-19 Fall Seasonal Vaccine 48yrs & older 02/11/2023   Moderna Covid-19 Vaccine Bivalent Booster 38yrs & up 11/15/2019, 12/13/2019   Pneumococcal-Unspecified 02/16/2015   Unspecified SARS-COV-2 Vaccination 09/15/2020, 03/23/2021, 07/27/2021, 04/02/2022, 08/01/2023   Pertinent   Health Maintenance Due  Topic Date Due   INFLUENZA VACCINE  06/05/2023   DEXA SCAN  Discontinued      06/12/2023    2:19 PM  Fall Risk  Falls in the past year? 0   Functional Status Survey:    Vitals:   08/11/23 1035 08/11/23 1042  BP: (!) 151/73 (!) 106/54  Pulse: 71   Resp: 14   Temp: 97.6 F (36.4 C)   SpO2: 93%   Weight: 146 lb 6.4 oz (66.4 kg)   Height: 5' 3.5" (1.613 m)    Body mass index is 25.53 kg/m. Physical Exam Cardiovascular:     Rate and Rhythm: Normal rate.     Comments: 1+ pulses bilaterally Pulmonary:     Effort: Pulmonary effort is normal.     Breath sounds: Normal breath sounds.  Abdominal:     General: Abdomen is flat.     Palpations: Abdomen is soft.  Skin:    Comments: Bilateral feet with ulcerations of the toes, cold to the touch, pale.  Neurological:     Mental Status: She is alert.     Labs reviewed: Recent Labs    03/05/23 0000 03/20/23 0000  NA 141 139  K 4.1 4.5  CL 104 102  CO2 30* 28*  BUN 17 30*  CREATININE 0.8 0.9  CALCIUM 8.7 8.8   Recent Labs    03/05/23 0000 03/20/23 0000  AST  --  14  ALT  --  9  ALKPHOS  --  51  ALBUMIN 3.6 3.5   Recent Labs    03/20/23 0000  WBC 14.6  NEUTROABS 9,008.00  HGB 12.4  HCT 38  PLT 159   No results found for: "TSH" No results found for: "HGBA1C" Lab Results  Component Value Date   CHOL 175 03/05/2023   HDL 39 03/05/2023   LDLCALC 102 03/05/2023   TRIG 219 (A) 03/05/2023    Significant Diagnostic Results in last 30 days:  No results found.  Assessment/Plan Severe late onset Alzheimer's dementia without behavioral disturbance, psychotic disturbance, mood disturbance, or anxiety (HCC)  Recurrent major depressive disorder, in full remission (HCC)  Skin ulcer of toe, limited to breakdown of skin, unspecified laterality (HCC)  Benign essential hypertension  Healing pressure injury, stage 2 (HCC) Patient with late onset Alzheimer's with notable decline given  recent wounds of the sacral region which is now healed however unhealed bilateral toe ulcers present.  Weight is stable.  Patient is nonambulatory requires a Nurse, adult for transferring.  She has been stable on Namenda for many years.  Mirtazapine nightly for sleep and aids in appetite stimulation.  Patient has taken Prosource for protein supplementation.  Mood stable with Zoloft 50 mg daily.  Patient is often disoriented and has nonsensical speech.  Blood pressure low to normal without medications at this time.  Encourage hydration regularly.  Continue goals of care conversations with family at this time their goal would be comfort measures if she were to get ill however there are no signs of acute illness at this time.  Patient will  likely need an updated MOST form given no longer under hospice care.  Family/ staff Communication: nursing  Labs/tests ordered:  none

## 2023-09-10 LAB — LIPID PANEL
Cholesterol: 178 (ref 0–200)
HDL: 45 (ref 35–70)
LDL Cholesterol: 103
Triglycerides: 180 — AB (ref 40–160)

## 2023-09-10 LAB — BASIC METABOLIC PANEL
BUN: 19 (ref 4–21)
CO2: 29 — AB (ref 13–22)
Chloride: 102 (ref 99–108)
Creatinine: 0.6 (ref 0.5–1.1)
Glucose: 82
Potassium: 4.2 meq/L (ref 3.5–5.1)
Sodium: 137 (ref 137–147)

## 2023-09-10 LAB — COMPREHENSIVE METABOLIC PANEL
Albumin: 3.9 (ref 3.5–5.0)
Calcium: 9 (ref 8.7–10.7)
Globulin: 3.2
eGFR: 80

## 2023-09-10 LAB — CBC AND DIFFERENTIAL
HCT: 39 (ref 36–46)
Hemoglobin: 12.8 (ref 12.0–16.0)
Neutrophils Absolute: 4382
Platelets: 235 10*3/uL (ref 150–400)
WBC: 8.1

## 2023-09-10 LAB — CBC: RBC: 4.17 (ref 3.87–5.11)

## 2023-09-10 LAB — HEPATIC FUNCTION PANEL
ALT: 5 U/L — AB (ref 7–35)
AST: 11 — AB (ref 13–35)

## 2023-10-09 ENCOUNTER — Encounter: Payer: Self-pay | Admitting: Nurse Practitioner

## 2023-10-09 ENCOUNTER — Non-Acute Institutional Stay (SKILLED_NURSING_FACILITY): Payer: Medicare Other | Admitting: Nurse Practitioner

## 2023-10-09 DIAGNOSIS — F3342 Major depressive disorder, recurrent, in full remission: Secondary | ICD-10-CM | POA: Diagnosis not present

## 2023-10-09 DIAGNOSIS — M159 Polyosteoarthritis, unspecified: Secondary | ICD-10-CM

## 2023-10-09 DIAGNOSIS — F02C0 Dementia in other diseases classified elsewhere, severe, without behavioral disturbance, psychotic disturbance, mood disturbance, and anxiety: Secondary | ICD-10-CM

## 2023-10-09 DIAGNOSIS — G301 Alzheimer's disease with late onset: Secondary | ICD-10-CM

## 2023-10-09 DIAGNOSIS — E559 Vitamin D deficiency, unspecified: Secondary | ICD-10-CM

## 2023-10-09 DIAGNOSIS — I10 Essential (primary) hypertension: Secondary | ICD-10-CM

## 2023-10-09 NOTE — Progress Notes (Signed)
Location:  Other [place].  Nursing Home Room Number: [facility] 510A Place of Service:  SNF [phone]) Abbey Chatters, NP  PCP: Earnestine Mealing, MD  Patient Care Team: Earnestine Mealing, MD as PCP - General [facility] Medicine)  Extended Emergency Contact Information Primary Emergency Contact: [facility] Address: [phone]          [name], [place] [address] [name] Home Phone: [phone] Mobile Phone: [phone] Relation: None  Goals of care: Advanced Directive information    10/09/2023    1:04 PM  Advanced Directives  Does Patient Have a Medical Advance Directive? Yes  Type of Estate agent of [place];Out of facility DNR (pink MOST or yellow form);Living will  Does patient want to make changes to medical advance directive? No - Patient declined  Copy of Healthcare Power of Attorney in Chart? Yes - validated most recent copy scanned in chart (See row information)     Chief Complaint  Patient presents with   Medical Management of Chronic Issues    Medical Management of Chronic Issues.     HPI:  Pt is a 89 y.o. female seen today for medical management of chronic disease.  Pt with hx of htn, depression, dementia, OA, vit d def.  She is at [facility] for long term care and requires total care by staff.  Staff feeds her and she continues to eat well most days.  She will have days were she is more alert and conversational and other days where she sleeps most of the day.  Weight has been stable.  No skin concerns.    History reviewed. No pertinent past medical history. History reviewed. No pertinent surgical history.  Allergies  Allergen Reactions   Ambien [Zolpidem]    Gentamicin Other (See Comments)   Hydrocodone    Penicillins    Sulfa Antibiotics Other (See Comments)   Trazodone And Nefazodone    Yellow Dyes (Non-Tartrazine)     Outpatient Encounter Medications as of 10/09/2023  Medication Sig   acetaminophen  (TYLENOL) 500 MG tablet Take 1,000 mg by mouth 3 (three) times daily.   Emollient (AQUAPHOR OINTMENT BODY EX) Apply 1 Application topically every 6 (six) hours as needed (for dry skin/lips).   guaiFENesin (MUCUS & CHEST CONGESTION) 100 MG/5ML LIQD Take 10 mLs by mouth every 4 (four) hours as needed.   Infant Care Products [facility]) OINT Apply topically as needed. Apply to groin, thigh and buttock every day and evening shift   memantine (NAMENDA) 10 MG tablet Take 10 mg by mouth 2 (two) times daily.   Menthol-Methyl Salicylate (BENGAY GREASELESS EX) Apply topically as needed. Apply to both knees   mirtazapine (REMERON) 15 MG tablet Take 15 mg by mouth at bedtime.   nystatin powder Apply 1 Application topically 2 (two) times daily as needed. Apply to groin for yeast rash   polyethylene glycol (MIRALAX / GLYCOLAX) 17 g packet Take 17 g by mouth every 12 (twelve) hours as needed for mild constipation, moderate constipation or severe constipation.   Protein (PROSOURCE PO) Mix one packet in 8 oz of juice daily.   sertraline (ZOLOFT) 50 MG tablet Take 50 mg by mouth daily.   Vitamin D, Ergocalciferol, 50000 units CAPS Take 1 capsule by mouth every 30 (thirty) days. Starting on the 2nd and ending on the 2nd every month for SUPPLEMENT   No facility-administered encounter medications on file as of 10/09/2023.    Review of Systems  Unable to perform ROS:  Dementia     Immunization History  Administered Date(s) Administered   Influenza-Unspecified 08/22/2020, 08/20/2021, 08/20/2022, 08/27/2023   Moderna Covid-19 Fall Seasonal Vaccine 56yrs & older 02/11/2023   Moderna Covid-19 Vaccine Bivalent Booster 60yrs & up 11/15/2019, 12/13/2019   Pneumococcal-Unspecified 02/16/2015   Unspecified SARS-COV-2 Vaccination 09/15/2020, 03/23/2021, 07/27/2021, 04/02/2022, 08/01/2023   Pertinent  Health Maintenance Due  Topic Date Due   INFLUENZA VACCINE  Completed   DEXA SCAN  Discontinued      06/12/2023     2:19 PM  Fall Risk  Falls in the past year? 0   Functional Status Survey:    Vitals:   10/09/23 1259  BP: 132/73  Pulse: 77  Resp: 16  Temp: (!) 97.5 F (36.4 C)  SpO2: 98%  Weight: 139 lb 9.6 oz (63.3 kg)  Height: 5' 3.5" (1.613 m)   Body mass index is 24.34 kg/m. Physical Exam Constitutional:      General: She is not in acute distress.    Appearance: She is well-developed. She is not diaphoretic.  HENT:     Head: Normocephalic and atraumatic.     Mouth/Throat:     Pharynx: No oropharyngeal exudate.  Eyes:     Conjunctiva/sclera: Conjunctivae normal.     Pupils: Pupils are equal, round, and reactive to light.  Cardiovascular:     Rate and Rhythm: Normal rate and regular rhythm.     Heart sounds: Normal heart sounds.  Pulmonary:     Effort: Pulmonary effort is normal.     Breath sounds: Normal breath sounds.  Abdominal:     General: Bowel sounds are normal.     Palpations: Abdomen is soft.  Musculoskeletal:     Cervical back: Normal range of motion and neck supple.     Right lower leg: No edema.     Left lower leg: No edema.  Skin:    General: Skin is warm and dry.  Neurological:     Mental Status: She is alert. She is disoriented.     Motor: Weakness present.     Gait: Gait abnormal.  Psychiatric:        Mood and Affect: Mood normal.     Labs reviewed: Recent Labs    03/05/23 0000 03/20/23 0000 09/10/23 0000  NA 141 139 137  K 4.1 4.5 4.2  CL 104 102 102  CO2 30* 28* 29*  BUN 17 30* 19  CREATININE 0.8 0.9 0.6  CALCIUM 8.7 8.8 9.0   Recent Labs    03/05/23 0000 03/20/23 0000 09/10/23 0000  AST  --  14 11*  ALT  --  9 5*  ALKPHOS  --  51  --   ALBUMIN 3.6 3.5 3.9   Recent Labs    03/20/23 0000 09/10/23 0000  WBC 14.6 8.1  NEUTROABS 9,008.00 4,382.00  HGB 12.4 12.8  HCT 38 39  PLT 159 235   No results found for: "TSH" No results found for: "HGBA1C" Lab Results  Component Value Date   CHOL 178 09/10/2023   HDL 45 09/10/2023    LDLCALC 103 09/10/2023   TRIG 180 (A) 09/10/2023    Significant Diagnostic Results in last 30 days:  No results found.  Assessment/Plan 1. Benign essential hypertension Diet controlled.   2. Recurrent major depressive disorder, in full remission (HCC) -mood has been stable. Continues on zoloft 50 mg daily and remeron   3. Osteoarthritis of multiple joints, unspecified osteoarthritis type -overall controlled on tylenol 1000 mg TID.  4. Severe late onset Alzheimer's dementia without behavioral disturbance, psychotic disturbance, mood disturbance, or anxiety (HCC) -Stable, no acute changes in cognitive or functional status, continue supportive care.  Continues on namenda.   5. Vitamin D deficiency Continues on supplement    Donasia Wimes K. Biagio Borg [facility] [phone]

## 2023-12-02 ENCOUNTER — Encounter: Payer: Self-pay | Admitting: Nurse Practitioner

## 2023-12-02 ENCOUNTER — Non-Acute Institutional Stay (SKILLED_NURSING_FACILITY): Payer: Medicare Other | Admitting: Nurse Practitioner

## 2023-12-02 DIAGNOSIS — G301 Alzheimer's disease with late onset: Secondary | ICD-10-CM

## 2023-12-02 DIAGNOSIS — M159 Polyosteoarthritis, unspecified: Secondary | ICD-10-CM

## 2023-12-02 DIAGNOSIS — J302 Other seasonal allergic rhinitis: Secondary | ICD-10-CM

## 2023-12-02 DIAGNOSIS — R630 Anorexia: Secondary | ICD-10-CM

## 2023-12-02 DIAGNOSIS — E559 Vitamin D deficiency, unspecified: Secondary | ICD-10-CM

## 2023-12-02 DIAGNOSIS — L97501 Non-pressure chronic ulcer of other part of unspecified foot limited to breakdown of skin: Secondary | ICD-10-CM

## 2023-12-02 DIAGNOSIS — I10 Essential (primary) hypertension: Secondary | ICD-10-CM | POA: Diagnosis not present

## 2023-12-02 DIAGNOSIS — F02C0 Dementia in other diseases classified elsewhere, severe, without behavioral disturbance, psychotic disturbance, mood disturbance, and anxiety: Secondary | ICD-10-CM

## 2023-12-02 DIAGNOSIS — F3342 Major depressive disorder, recurrent, in full remission: Secondary | ICD-10-CM | POA: Diagnosis not present

## 2023-12-02 NOTE — Progress Notes (Signed)
Location:  [facility] [place].  Nursing Home Room Number: [facility] 510A Place of Service:  SNF [phone]) Abbey Chatters, NP  PCP: Earnestine Mealing, MD  Patient Care Team: Earnestine Mealing, MD as PCP - General [facility] Medicine)  Extended Emergency Contact Information Primary Emergency Contact: [facility] Address: [phone]          [name], [place] [address] [name] Home Phone: [phone] Mobile Phone: [phone] Relation: None  Goals of care: Advanced Directive information    12/02/2023    9:33 AM  Advanced Directives  Does Patient Have a Medical Advance Directive? Yes  Type of Estate agent of [place];Out of facility DNR (pink MOST or yellow form);Living will  Does patient want to make changes to medical advance directive? No - Patient declined  Copy of Healthcare Power of Attorney in Chart? Yes - validated most recent copy scanned in chart (See row information)   Chief Complaint  Patient presents with   Medical Management of Chronic Issues    Medical Management of Chronic Issues.    HPI:  Pt is a 89 y.o. female seen today for medical management of chronic disease. She has a history of HTN, depression, HLD, dementia, anxiety, toe ulcers, OA, and vitamin D deficiency. She is a total care patient residing at [facility] long term care unit. Per staff, she eats normally one day, then the next day she will not each much, about 25% of her meal. She mostly likes to eat breakfast. Her last BM was yesterday. She has been yelling out and moaning a bit more lately. Staff has given her guaifenesin and extra fluids. Her mouth has been very dry in the last few days and she is requiring additional oral care throughout the day. She is having some thick mucus and she has been thirsty in the last few days. No fevers, diarrhea, or sick symptoms and her weight has been stable per nursing staff. Pt complains of being cold today which is normal for her.  She denies any pain at this time. Nursing also reports clearing of the throat and as if she is trying to cough up drainage but unable to do so.  History reviewed. No pertinent past medical history. History reviewed. No pertinent surgical history.  Allergies  Allergen Reactions   Ambien [Zolpidem]    Gentamicin Other (See Comments)   Hydrocodone    Penicillins    Sulfa Antibiotics Other (See Comments)   Trazodone And Nefazodone    Yellow Dyes (Non-Tartrazine)    Outpatient Encounter Medications as of 12/02/2023  Medication Sig   acetaminophen (TYLENOL) 500 MG tablet Take 1,000 mg by mouth 3 (three) times daily.   aluminum-magnesium hydroxide 200-200 MG/5ML suspension Take 10 mLs by mouth every 4 (four) hours as needed for indigestion.   Emollient (AQUAPHOR OINTMENT BODY EX) Apply 1 Application topically every 6 (six) hours as needed (for dry skin/lips).   guaiFENesin (MUCUS & CHEST CONGESTION) 100 MG/5ML LIQD Take 10 mLs by mouth every 4 (four) hours as needed.   Infant Care Products [facility]) OINT Apply topically as needed. Apply to groin, thigh and buttock every day and evening shift   memantine (NAMENDA) 10 MG tablet Take 10 mg by mouth 2 (two) times daily.   Menthol-Methyl Salicylate (BENGAY GREASELESS EX) Apply topically as needed. Apply to both knees   mirtazapine (REMERON) 15 MG tablet Take 15 mg by mouth at bedtime.   nystatin powder Apply 1 Application topically 2 (two) times  daily as needed. Apply to groin for yeast rash   polyethylene glycol (MIRALAX / GLYCOLAX) 17 g packet Take 17 g by mouth every 12 (twelve) hours as needed for mild constipation, moderate constipation or severe constipation.   Protein (PROSOURCE PO) Mix one packet in 8 oz of juice daily.   sertraline (ZOLOFT) 50 MG tablet Take 50 mg by mouth daily.   Vitamin D, Ergocalciferol, 50000 units CAPS Take 1 capsule by mouth every 30 (thirty) days. Starting on the 2nd and ending on the 2nd every month for  SUPPLEMENT   No facility-administered encounter medications on file as of 12/02/2023.   Review of Systems  Unable to perform ROS: Dementia    Immunization History  Administered Date(s) Administered   Influenza-Unspecified 08/22/2020, 08/20/2021, 08/20/2022, 08/27/2023   Moderna Covid-19 Fall Seasonal Vaccine 34yrs & older 02/11/2023   Moderna Covid-19 Vaccine Bivalent Booster 17yrs & up 11/15/2019, 12/13/2019   Pneumococcal-Unspecified 02/16/2015   Unspecified SARS-COV-2 Vaccination 09/15/2020, 03/23/2021, 07/27/2021, 04/02/2022, 08/01/2023   Pertinent  Health Maintenance Due  Topic Date Due   INFLUENZA VACCINE  Completed   DEXA SCAN  Discontinued      06/12/2023    2:19 PM  Fall Risk  Falls in the past year? 0   Functional Status Survey:   Vitals:   12/02/23 0924  BP: 133/81  Pulse: 71  Resp: 16  Temp: 97.6 F (36.4 C)  SpO2: 92%  Weight: 142 lb (64.4 kg)  Height: 5' 3.5" (1.613 m)   Body mass index is 24.76 kg/m. Physical Exam Vitals reviewed.  Constitutional:      Appearance: She is normal weight.  HENT:     Head: Normocephalic and atraumatic.     Nose: No congestion.     Mouth/Throat:     Mouth: Mucous membranes are dry.     Comments: Dried drainage to mouth Eyes:     Extraocular Movements: Extraocular movements intact.  Cardiovascular:     Rate and Rhythm: Normal rate and regular rhythm.     Pulses: Normal pulses.     Heart sounds: Normal heart sounds.  Pulmonary:     Effort: Pulmonary effort is normal.     Breath sounds: Normal breath sounds.  Abdominal:     General: Abdomen is flat. Bowel sounds are normal.  Musculoskeletal:        General: No swelling.     Cervical back: Neck supple.  Feet:     Right foot:     Skin integrity: Ulcer present.     Left foot:     Skin integrity: Ulcer present.  Skin:    General: Skin is warm and dry.  Neurological:     Mental Status: She is alert. Mental status is at baseline. She is disoriented.   Psychiatric:        Cognition and Memory: Cognition is impaired. Memory is impaired.    Labs reviewed: Recent Labs    03/05/23 0000 03/20/23 0000 09/10/23 0000  NA 141 139 137  K 4.1 4.5 4.2  CL 104 102 102  CO2 30* 28* 29*  BUN 17 30* 19  CREATININE 0.8 0.9 0.6  CALCIUM 8.7 8.8 9.0   Recent Labs    03/05/23 0000 03/20/23 0000 09/10/23 0000  AST  --  14 11*  ALT  --  9 5*  ALKPHOS  --  51  --   ALBUMIN 3.6 3.5 3.9   Recent Labs    03/20/23 0000 09/10/23 0000  WBC 14.6  8.1  NEUTROABS 9,008.00 4,382.00  HGB 12.4 12.8  HCT 38 39  PLT 159 235   No results found for: "TSH" No results found for: "HGBA1C" Lab Results  Component Value Date   CHOL 178 09/10/2023   HDL 45 09/10/2023   LDLCALC 103 09/10/2023   TRIG 180 (A) 09/10/2023   Significant Diagnostic Results in last 30 days:  No results found.  Assessment/Plan  1. Benign essential hypertension (Primary) - Blood pressure stable, 133/81 today. - No blood pressure medications on file.  2. Recurrent major depressive disorder, in full remission (HCC) - Stable, continue sertraline and mirtazapine as prescribed.  3. Osteoarthritis of multiple joints, unspecified osteoarthritis type - Stable, patient denies any pain at this time. - Continue tylenol 500 mg three times daily for pain relief.  4. Vitamin D deficiency - Check vitamin D level at next visit. - Continue vitamin D 50,000 units as prescribed.  5. Severe late onset Alzheimer's dementia without behavioral disturbance, psychotic disturbance, mood disturbance, or anxiety (HCC) - Patient is at baseline in regards to Alzheimer's dementia. - Continue memantine twice daily as prescribed. - Continue supportive care.  6. Skin ulcer of toe, limited to breakdown of skin, unspecified laterality (HCC) - ulcers noted on assessment that nursing is following Followed by wound care - Continue wearing foam boots and offloading bilateral feet, avoid closed toed  shoes, keep toes clean, dry, and continue current treatment.   7. Decreased appetite - weight has been stable -Continue Pro source daily. - Continue mirtazapine daily.  8. Seasonal allergic rhinitis, unspecified trigger - Start taking loratidine daily for allergic rhinitis. - Continue guaifenesin daily to loosen mucus and increase PO fluid intake.   Lenord Fellers, DNP AGPCNP Student -I personally was present during the history, physical exam and medical decision-making activities of this service and have verified that the service and findings are accurately documented in the student's note Orpah Hausner K. Biagio Borg [facility] [phone]

## 2023-12-02 NOTE — Progress Notes (Deleted)
Location:  [facility] [place].  Nursing Home Room Number: [facility] [address]A Place of Service:  SNF [phone]) Abbey Chatters, NP  PCP: Earnestine Mealing, MD  Patient Care Team: Earnestine Mealing, MD as PCP - General [facility] Medicine)  Extended Emergency Contact Information Primary Emergency Contact: [facility] Address: [phone]          [name], [place] [address] [name] Home Phone: [phone] Mobile Phone: [phone] Relation: None  Goals of care: Advanced Directive information    12/02/2023    9:33 AM  Advanced Directives  Does Patient Have a Medical Advance Directive? Yes  Type of Estate agent of [place];Out of facility DNR (pink MOST or yellow form);Living will  Does patient want to make changes to medical advance directive? No - Patient declined  Copy of Healthcare Power of Attorney in Chart? Yes - validated most recent copy scanned in chart (See row information)     Chief Complaint  Patient presents with   Medical Management of Chronic Issues    Medical Management of Chronic Issues.     HPI:  Pt is a 89 y.o. female seen today for medical management of chronic disease.    History reviewed. No pertinent past medical history. History reviewed. No pertinent surgical history.  Allergies  Allergen Reactions   Ambien [Zolpidem]    Gentamicin Other (See Comments)   Hydrocodone    Penicillins    Sulfa Antibiotics Other (See Comments)   Trazodone And Nefazodone    Yellow Dyes (Non-Tartrazine)     Outpatient Encounter Medications as of 12/02/2023  Medication Sig   acetaminophen (TYLENOL) 500 MG tablet Take 1,000 mg by mouth 3 (three) times daily.   aluminum-magnesium hydroxide 200-200 MG/5ML suspension Take 10 mLs by mouth every 4 (four) hours as needed for indigestion.   Emollient (AQUAPHOR OINTMENT BODY EX) Apply 1 Application topically every 6 (six) hours as needed (for dry skin/lips).   guaiFENesin (MUCUS & CHEST  CONGESTION) 100 MG/5ML LIQD Take 10 mLs by mouth every 4 (four) hours as needed.   Infant Care Products [facility]) OINT Apply topically as needed. Apply to groin, thigh and buttock every day and evening shift   memantine (NAMENDA) 10 MG tablet Take 10 mg by mouth 2 (two) times daily.   Menthol-Methyl Salicylate (BENGAY GREASELESS EX) Apply topically as needed. Apply to both knees   mirtazapine (REMERON) 15 MG tablet Take 15 mg by mouth at bedtime.   nystatin powder Apply 1 Application topically 2 (two) times daily as needed. Apply to groin for yeast rash   polyethylene glycol (MIRALAX / GLYCOLAX) 17 g packet Take 17 g by mouth every 12 (twelve) hours as needed for mild constipation, moderate constipation or severe constipation.   Protein (PROSOURCE PO) Mix one packet in 8 oz of juice daily.   sertraline (ZOLOFT) 50 MG tablet Take 50 mg by mouth daily.   Vitamin D, Ergocalciferol, 50000 units CAPS Take 1 capsule by mouth every 30 (thirty) days. Starting on the 2nd and ending on the 2nd every month for SUPPLEMENT   No facility-administered encounter medications on file as of 12/02/2023.    Review of Systems ***  Immunization History  Administered Date(s) Administered   Influenza-Unspecified 08/22/2020, 08/20/2021, 08/20/2022, 08/27/2023   Moderna Covid-19 Fall Seasonal Vaccine 45yrs & older 02/11/2023   Moderna Covid-19 Vaccine Bivalent Booster 51yrs & up 11/15/2019, 12/13/2019   Pneumococcal-Unspecified 02/16/2015   Unspecified SARS-COV-2 Vaccination 09/15/2020, 03/23/2021, 07/27/2021, 04/02/2022, 08/01/2023   Pertinent  Health Maintenance Due  Topic Date Due   INFLUENZA VACCINE  Completed   DEXA SCAN  Discontinued      06/12/2023    2:19 PM  Fall Risk  Falls in the past year? 0   Functional Status Survey:    Vitals:   12/02/23 0924  BP: 133/81  Pulse: 71  Resp: 16  Temp: 97.6 F (36.4 C)  SpO2: 92%  Weight: 142 lb (64.4 kg)  Height: 5' 3.5" (1.613 m)   Body mass index  is 24.76 kg/m. Physical Exam***  Labs reviewed: Recent Labs    03/05/23 0000 03/20/23 0000 09/10/23 0000  NA 141 139 137  K 4.1 4.5 4.2  CL 104 102 102  CO2 30* 28* 29*  BUN 17 30* 19  CREATININE 0.8 0.9 0.6  CALCIUM 8.7 8.8 9.0   Recent Labs    03/05/23 0000 03/20/23 0000 09/10/23 0000  AST  --  14 11*  ALT  --  9 5*  ALKPHOS  --  51  --   ALBUMIN 3.6 3.5 3.9   Recent Labs    03/20/23 0000 09/10/23 0000  WBC 14.6 8.1  NEUTROABS 9,008.00 4,382.00  HGB 12.4 12.8  HCT 38 39  PLT 159 235   No results found for: "TSH" No results found for: "HGBA1C" Lab Results  Component Value Date   CHOL 178 09/10/2023   HDL 45 09/10/2023   LDLCALC 103 09/10/2023   TRIG 180 (A) 09/10/2023    Significant Diagnostic Results in last 30 days:  No results found.  Assessment/Plan There are no diagnoses linked to this encounter.   Janene Harvey. Biagio Borg [facility] [phone]

## 2023-12-12 ENCOUNTER — Non-Acute Institutional Stay (SKILLED_NURSING_FACILITY): Payer: Self-pay | Admitting: Student

## 2023-12-12 ENCOUNTER — Encounter: Payer: Self-pay | Admitting: Student

## 2023-12-12 DIAGNOSIS — F02C3 Dementia in other diseases classified elsewhere, severe, with mood disturbance: Secondary | ICD-10-CM

## 2023-12-12 DIAGNOSIS — Z66 Do not resuscitate: Secondary | ICD-10-CM

## 2023-12-12 DIAGNOSIS — F3342 Major depressive disorder, recurrent, in full remission: Secondary | ICD-10-CM | POA: Diagnosis not present

## 2023-12-12 DIAGNOSIS — G301 Alzheimer's disease with late onset: Secondary | ICD-10-CM

## 2023-12-12 NOTE — Progress Notes (Signed)
 Location:  [facility] Room Number: 510 A Place of Service:  SNF [phone]) Provider:  Abdul Fine, MD  Patient Care Team: Abdul Fine, MD as PCP - General (Family Medicine)  Extended Emergency Contact Information Primary Emergency Contact: [facility] Address: [phone]          [place], [place] [address] [place] Home Phone: [phone] Mobile Phone: [phone] Relation: None  Code Status:   Goals of care: Advanced Directive information    12/02/2023    9:33 AM  Advanced Directives  Does Patient Have a Medical Advance Directive? Yes  Type of Estate Agent of [place];Out of facility DNR (pink MOST or yellow form);Living will  Does patient want to make changes to medical advance directive? No - Patient declined  Copy of Healthcare Power of Attorney in Chart? Yes - validated most recent copy scanned in chart (See row information)     Chief Complaint  Patient presents with   Decline    HPI:  Pt is a 89 y.o. female seen today for an acute and routine visit for acute decline. Patient is resting peacefully.  Patient with a history of dementia for the last 7 days has had decreased PO intake. She has missed her medication 3-4 times this week because she has not taken anything by mouth. She has a history of dementia and until this point, has been conversant, however, nonsensical and to people/inanimate objects that did not exist. Discussed with son and DIL concern that patient has had significant change in her eating and drinking and concern that she is reaching the level for comfort care. They state certain individuals are better at giving her medication, however, discussed concern that she hasn't taken the medication from anyone. They state at this time they do not want hospice, as this additional service is not necessary. Hospice also previously tried to discontinue her medications, which they would like to  continue. Discussed concern that her hallucinations are not disturbing to her as she seems to be crying out in terror. Previously, we did not treat her because she was redirectable, however, now she is having more and more agitation. At this time, patient does not desire hospitalization unless patien thas an injury that needs to be addressed. Discussed importance of maintaining patient scomfort. Discussed their desire for patient to receive end of life care in the facility. Reassures this is possible and they have decided if she   Family members were informed of the potential risks and benefits of starting/increasing the dose of psychotropic medications, in accordance with CMS guidelines for informed consent.   The discussion included the following key points: - Purpose of Medication: Explanation of the specific psychiatric condition being treated (e.g., depression, anxiety, agitation) and how the medication is expected to improve the patient's symptoms. - Risks: A detailed review of the potential side effects of the medication(s), including but not limited to sedation, cognitive decline, falls, weight gain, or changes in behavior. The risks of overmedication or inappropriate dosing, as well as the potential for drug interactions, were discussed. - Benefits: The potential benefits of the medication(s) were explained, including symptom relief, improved quality of life, and possible reduction in distress or behavioral problems. It was emphasized that benefits may vary and are not guaranteed. - Alternative Approaches:Other non-pharmacological treatments or alternative medications, if applicable, were reviewed, and the patient/family was given the opportunity to ask questions and express preferences. - Monitoring Plan: The need for close monitoring of  medication effects, including follow-up appointments, lab tests, and behavioral assessments, was outlined. Family members were made aware of how the patient's  progress will be tracked. - Voluntary Participation: It was emphasized that the decision to start or increase the medication is voluntary and the family has the right to withdraw consent at any time. The family was encouraged to ask questions and seek further clarification as needed.  The family acknowledged understanding the risks, benefits, and alternatives discussed and gave verbal consent to proceed with the treatment plan. Written consent will be obtained prior to medication administration.  History reviewed. No pertinent past medical history. History reviewed. No pertinent surgical history.  Allergies  Allergen Reactions   Ambien [Zolpidem]    Gentamicin Other (See Comments)   Hydrocodone    Penicillins    Sulfa Antibiotics Other (See Comments)   Trazodone And Nefazodone    Yellow Dyes (Non-Tartrazine)     Outpatient Encounter Medications as of 12/12/2023  Medication Sig   Emollient (AQUAPHOR OINTMENT BODY EX) Apply 1 Application topically every 6 (six) hours as needed (for dry skin/lips).   Ensure (ENSURE) Take 237 mLs by mouth daily.   loratadine (CLARITIN) 10 MG tablet Take 10 mg by mouth daily.   Lorazepam  POWD by Does not apply route. Apply topically every 8 hours as needed for agitation   Menthol-Methyl Salicylate (BENGAY GREASELESS EX) Apply topically as needed. Apply to both knees   OLANZapine  (ZYPREXA ) 2.5 MG tablet Take 2.5 mg by mouth every 6 (six) hours as needed. Disintegrating tablet   [DISCONTINUED] UNABLE TO FIND Med Name:  PROSOURCE - MIX PACKET IN 8 OZ OF JUICE DAILY. one time a day for SUPPLEMENT (Patient not taking: Reported on 12/12/2023)   acetaminophen (TYLENOL) 500 MG tablet Take 1,000 mg by mouth 3 (three) times daily.   aluminum-magnesium hydroxide 200-200 MG/5ML suspension Take 10 mLs by mouth every 4 (four) hours as needed for indigestion.   guaiFENesin (MUCUS & CHEST CONGESTION) 100 MG/5ML LIQD Take 10 mLs by mouth every 4 (four) hours as needed.    Infant Care Products [facility]) OINT Apply topically as needed. Apply to groin, thigh and buttock every day and evening shift   memantine (NAMENDA) 10 MG tablet Take 10 mg by mouth 2 (two) times daily.   mirtazapine (REMERON) 15 MG tablet Take 15 mg by mouth at bedtime.   nystatin powder Apply 1 Application topically 2 (two) times daily as needed. Apply to groin for yeast rash   polyethylene glycol (MIRALAX / GLYCOLAX) 17 g packet Take 17 g by mouth every 12 (twelve) hours as needed for mild constipation, moderate constipation or severe constipation.   Protein (PROSOURCE PO) Mix one packet in 8 oz of juice daily.   sertraline (ZOLOFT) 50 MG tablet Take 50 mg by mouth daily.   Vitamin D, Ergocalciferol, 50000 units CAPS Take 1 capsule by mouth every 30 (thirty) days. Starting on the 2nd and ending on the 2nd every month for SUPPLEMENT   No facility-administered encounter medications on file as of 12/12/2023.    Review of Systems  Immunization History  Administered Date(s) Administered   Influenza-Unspecified 08/22/2020, 08/20/2021, 08/20/2022, 08/27/2023   Moderna Covid-19 Fall Seasonal Vaccine 65yrs & older 02/11/2023   Moderna Covid-19 Vaccine Bivalent Booster 64yrs & up 11/15/2019, 12/13/2019   Pneumococcal-Unspecified 02/16/2015   Unspecified SARS-COV-2 Vaccination 09/15/2020, 03/23/2021, 07/27/2021, 04/02/2022, 08/01/2023   Pertinent  Health Maintenance Due  Topic Date Due   INFLUENZA VACCINE  Completed   DEXA SCAN  Discontinued      06/12/2023    2:19 PM  Fall Risk  Falls in the past year? 0   Functional Status Survey:    Vitals:   12/12/23 1028  BP: 133/81  Pulse: 71  Temp: 97.6 F (36.4 C)  Weight: 127 lb (57.6 kg)   Body mass index is 22.14 kg/m. Physical Exam  Labs reviewed: Recent Labs    03/05/23 0000 03/20/23 0000 09/10/23 0000  NA 141 139 137  K 4.1 4.5 4.2  CL 104 102 102  CO2 30* 28* 29*  BUN 17 30* 19  CREATININE 0.8 0.9 0.6  CALCIUM 8.7 8.8  9.0   Recent Labs    03/05/23 0000 03/20/23 0000 09/10/23 0000  AST  --  14 11*  ALT  --  9 5*  ALKPHOS  --  51  --   ALBUMIN 3.6 3.5 3.9   Recent Labs    03/20/23 0000 09/10/23 0000  WBC 14.6 8.1  NEUTROABS 9,008.00 4,382.00  HGB 12.4 12.8  HCT 38 39  PLT 159 235   No results found for: TSH No results found for: HGBA1C Lab Results  Component Value Date   CHOL 178 09/10/2023   HDL 45 09/10/2023   LDLCALC 103 09/10/2023   TRIG 180 (A) 09/10/2023    Significant Diagnostic Results in last 30 days:  No results found.  Assessment/Plan Severe late onset Alzheimer's dementia with mood disturbance (HCC) - Plan: Lorazepam  POWD  Recurrent major depressive disorder, in full remission (HCC) - Plan: Lorazepam  POWD  DNR (do not resuscitate) - Plan: Lorazepam  POWD Patient with signs of end of life with weight loss, decreased eating, sleeping more and terminal delirium. Vital signs stable at this time without clear indications for further work up. Will continue attempts to give food, hydration, and medication support. Continue medications at this time. Start comfort medications with ativan  and zyprexa  odt.    Family/ staff Communication: nursing, son  Labs/tests ordered:  none

## 2023-12-13 ENCOUNTER — Encounter: Payer: Self-pay | Admitting: Student

## 2023-12-13 MED ORDER — LORAZEPAM POWD: 1.0000 mg | Freq: Three times a day (TID) | 0 refills | Status: DC | PRN

## 2023-12-22 ENCOUNTER — Non-Acute Institutional Stay (SKILLED_NURSING_FACILITY): Payer: Self-pay | Admitting: Student

## 2023-12-22 DIAGNOSIS — F02C0 Dementia in other diseases classified elsewhere, severe, without behavioral disturbance, psychotic disturbance, mood disturbance, and anxiety: Secondary | ICD-10-CM

## 2023-12-22 DIAGNOSIS — G301 Alzheimer's disease with late onset: Secondary | ICD-10-CM

## 2023-12-22 DIAGNOSIS — F419 Anxiety disorder, unspecified: Secondary | ICD-10-CM | POA: Diagnosis not present

## 2023-12-22 NOTE — Progress Notes (Unsigned)
Location:  Other Nursing Home Room Number: [place] 510A Place of Service:  ALF [phone]) Provider:  Ander Gaster, Benetta Spar, MD  Patient Care Team: Earnestine Mealing, MD as PCP - General (Family Medicine)  Extended Emergency Contact Information Primary Emergency Contact: [facility] Address: [address]          [place], [place] [address] [name] Home Phone: [phone] Mobile Phone: [phone] Relation: Son  Code Status:  DNR Goals of care: Advanced Directive information    12/02/2023    9:33 AM  Advanced Directives  Does Patient Have a Medical Advance Directive? Yes  Type of Estate agent of [place];Out of facility DNR (pink MOST or yellow form);Living will  Does patient want to make changes to medical advance directive? No - Patient declined  Copy of Healthcare Power of Attorney in Chart? Yes - validated most recent copy scanned in chart (See row information)     Chief Complaint  Patient presents with   Dementia    HPI:  Pt is a 89 y.o. female seen today for an acute visit for increased behaviors. Patient has required PRN zyprexa 4x in 1 week. No adverse side effects. Continues to have poor PO intake. Periodic fluid and food intake.    History reviewed. No pertinent past medical history. History reviewed. No pertinent surgical history.  Allergies  Allergen Reactions   Ambien [Zolpidem]    Gentamicin Other (See Comments)   Hydrocodone    Penicillins    Sulfa Antibiotics Other (See Comments)   Trazodone And Nefazodone    Yellow Dyes (Non-Tartrazine)     Outpatient Encounter Medications as of 12/22/2023  Medication Sig   OLANZapine (ZYPREXA) 2.5 MG tablet Take 1 tablet (2.5 mg total) by mouth at bedtime.   acetaminophen (TYLENOL) 500 MG tablet Take 1,000 mg by mouth 3 (three) times daily.   aluminum-magnesium hydroxide 200-200 MG/5ML suspension Take 10 mLs by mouth every 4 (four) hours as needed for indigestion.    Emollient (AQUAPHOR OINTMENT BODY EX) Apply 1 Application topically every 6 (six) hours as needed (for dry skin/lips).   Ensure (ENSURE) Take 237 mLs by mouth daily.   guaiFENesin (MUCUS & CHEST CONGESTION) 100 MG/5ML LIQD Take 10 mLs by mouth every 4 (four) hours as needed.   Infant Care Products [facility]) OINT Apply topically as needed. Apply to groin, thigh and buttock every day and evening shift   loratadine (CLARITIN) 10 MG tablet Take 10 mg by mouth daily.   memantine (NAMENDA) 10 MG tablet Take 10 mg by mouth 2 (two) times daily.   Menthol-Methyl Salicylate (BENGAY GREASELESS EX) Apply topically as needed. Apply to both knees   mirtazapine (REMERON) 15 MG tablet Take 15 mg by mouth at bedtime.   nystatin powder Apply 1 Application topically 2 (two) times daily as needed. Apply to groin for yeast rash   OLANZapine (ZYPREXA) 2.5 MG tablet Take 2.5 mg by mouth every 6 (six) hours as needed. Disintegrating tablet   polyethylene glycol (MIRALAX / GLYCOLAX) 17 g packet Take 17 g by mouth every 12 (twelve) hours as needed for mild constipation, moderate constipation or severe constipation.   Protein (PROSOURCE PO) Mix one packet in 8 oz of juice daily.   sertraline (ZOLOFT) 50 MG tablet Take 50 mg by mouth daily.   Vitamin D, Ergocalciferol, 50000 units CAPS Take 1 capsule by mouth every 30 (thirty) days. Starting on the 2nd and ending on the 2nd every month for SUPPLEMENT   [DISCONTINUED]  Lorazepam POWD 1 mg by Does not apply route every 8 (eight) hours as needed (for agitation). Apply topically every 8 hours as needed for agitation   No facility-administered encounter medications on file as of 12/22/2023.    Review of Systems  Immunization History  Administered Date(s) Administered   Influenza-Unspecified 08/22/2020, 08/20/2021, 08/20/2022, 08/27/2023   Moderna Covid-19 Fall Seasonal Vaccine 74yrs & older 02/11/2023   Moderna Covid-19 Vaccine Bivalent Booster 28yrs & up 11/15/2019,  12/13/2019   Pneumococcal-Unspecified 02/16/2015   Unspecified SARS-COV-2 Vaccination 09/15/2020, 03/23/2021, 07/27/2021, 04/02/2022, 08/01/2023   Pertinent  Health Maintenance Due  Topic Date Due   INFLUENZA VACCINE  Completed   DEXA SCAN  Discontinued      06/12/2023    2:19 PM  Fall Risk  Falls in the past year? 0   Functional Status Survey:    There were no vitals filed for this visit. There is no height or weight on file to calculate BMI. Physical Exam Constitutional:      Comments: Resting comfortably in recliner     Labs reviewed: Recent Labs    03/05/23 0000 03/20/23 0000 09/10/23 0000  NA 141 139 137  K 4.1 4.5 4.2  CL 104 102 102  CO2 30* 28* 29*  BUN 17 30* 19  CREATININE 0.8 0.9 0.6  CALCIUM 8.7 8.8 9.0   Recent Labs    03/05/23 0000 03/20/23 0000 09/10/23 0000  AST  --  14 11*  ALT  --  9 5*  ALKPHOS  --  51  --   ALBUMIN 3.6 3.5 3.9   Recent Labs    03/20/23 0000 09/10/23 0000  WBC 14.6 8.1  NEUTROABS 9,008.00 4,382.00  HGB 12.4 12.8  HCT 38 39  PLT 159 235   No results found for: "TSH" No results found for: "HGBA1C" Lab Results  Component Value Date   CHOL 178 09/10/2023   HDL 45 09/10/2023   LDLCALC 103 09/10/2023   TRIG 180 (A) 09/10/2023    Significant Diagnostic Results in last 30 days:  No results found.  Assessment/Plan Anxiety  Severe late onset Alzheimer's dementia without behavioral disturbance, psychotic disturbance, mood disturbance, or anxiety (HCC) Patient with every other day night time and early morning doses of zyprexa. Given improvement of rest and decrease in outcries, will schedule zyprexa 2.5 mg nightly as previously discussed with family -- if efficacious, may need this medication scheduled.   Family/ staff Communication: nursing, will contact family for further discussion if further dose increase is required.   Labs/tests ordered:  none

## 2023-12-23 ENCOUNTER — Encounter: Payer: Self-pay | Admitting: Student

## 2023-12-23 MED ORDER — OLANZAPINE 2.5 MG PO TABS: 2.5000 mg | ORAL_TABLET | Freq: Every day | ORAL | Status: DC

## 2023-12-29 ENCOUNTER — Encounter: Payer: Self-pay | Admitting: Adult Health

## 2023-12-29 ENCOUNTER — Non-Acute Institutional Stay (SKILLED_NURSING_FACILITY): Payer: Self-pay | Admitting: Adult Health

## 2023-12-29 DIAGNOSIS — F02C0 Dementia in other diseases classified elsewhere, severe, without behavioral disturbance, psychotic disturbance, mood disturbance, and anxiety: Secondary | ICD-10-CM | POA: Diagnosis not present

## 2023-12-29 DIAGNOSIS — G301 Alzheimer's disease with late onset: Secondary | ICD-10-CM

## 2023-12-29 MED ORDER — QUETIAPINE FUMARATE 25 MG PO TABS: 12.5000 mg | ORAL_TABLET | Freq: Two times a day (BID) | ORAL | Status: DC | PRN

## 2023-12-29 MED ORDER — QUETIAPINE FUMARATE 25 MG PO TABS: 12.5000 mg | ORAL_TABLET | Freq: Every day | ORAL | Status: DC

## 2023-12-29 NOTE — Progress Notes (Signed)
 Location:  Other ([facility] Room Number: 510 A Place of Service:  SNF ([phone]) Provider:  Kenard Gower, DNP, FNP-BC  Patient Care Team: Earnestine Mealing, MD as PCP - General (Family Medicine)  Extended Emergency Contact Information Primary Emergency Contact: [facility] Address: [address]          [place], [place] [address] [name] Home Phone: [phone] Mobile Phone: [phone] Relation: Son  Code Status:  DNR  Goals of care: Advanced Directive information    12/02/2023    9:33 AM  Advanced Directives  Does Patient Have a Medical Advance Directive? Yes  Type of Estate agent of [place];Out of facility DNR (pink MOST or yellow form);Living will  Does patient want to make changes to medical advance directive? No - Patient declined  Copy of Healthcare Power of Attorney in Chart? Yes - validated most recent copy scanned in chart (See row information)     Chief Complaint  Patient presents with   Acute Visit    anxiety    HPI:  Pt is a 89 y.o. female seen today for an acute visit regarding anxiety. She is a resident of [facility]. She has severe alzheimer's dementia and was reported to intermittently scream day and night.. She currently takes Zyprexa for her behaviors but ineffective.   No past medical history on file. No past surgical history on file.  Allergies  Allergen Reactions   Ambien [Zolpidem]    Gentamicin Other (See Comments)   Hydrocodone    Penicillins    Sulfa Antibiotics Other (See Comments)   Trazodone And Nefazodone    Yellow Dyes (Non-Tartrazine)     Outpatient Encounter Medications as of 12/29/2023  Medication Sig   QUEtiapine (SEROQUEL) 25 MG tablet Take 0.5 tablets (12.5 mg total) by mouth at bedtime.   QUEtiapine (SEROQUEL) 25 MG tablet Take 0.5 tablets (12.5 mg total) by mouth 2 (two) times daily as needed.   acetaminophen (TYLENOL) 500 MG tablet Take  1,000 mg by mouth 3 (three) times daily.   aluminum-magnesium hydroxide 200-200 MG/5ML suspension Take 10 mLs by mouth every 4 (four) hours as needed for indigestion.   Emollient (AQUAPHOR OINTMENT BODY EX) Apply 1 Application topically every 6 (six) hours as needed (for dry skin/lips).   Ensure (ENSURE) Take 237 mLs by mouth daily.   guaiFENesin (MUCUS & CHEST CONGESTION) 100 MG/5ML LIQD Take 10 mLs by mouth every 4 (four) hours as needed.   Infant Care Products [facility]) OINT Apply topically as needed. Apply to groin, thigh and buttock every day and evening shift   loratadine (CLARITIN) 10 MG tablet Take 10 mg by mouth daily.   memantine (NAMENDA) 10 MG tablet Take 10 mg by mouth 2 (two) times daily.   Menthol-Methyl Salicylate (BENGAY GREASELESS EX) Apply topically as needed. Apply to both knees   mirtazapine (REMERON) 15 MG tablet Take 15 mg by mouth at bedtime.   nystatin powder Apply 1 Application topically 2 (two) times daily as needed. Apply to groin for yeast rash   polyethylene glycol (MIRALAX / GLYCOLAX) 17 g packet Take 17 g by mouth every 12 (twelve) hours as needed for mild constipation, moderate constipation or severe constipation.   Protein (PROSOURCE PO) Mix one packet in 8 oz of juice daily.   sertraline (ZOLOFT) 50 MG tablet Take 50 mg by mouth daily.   Vitamin D, Ergocalciferol, 50000 units CAPS Take 1 capsule by mouth every 30 (thirty) days.  Starting on the 2nd and ending on the 2nd every month for SUPPLEMENT   [DISCONTINUED] OLANZapine (ZYPREXA) 2.5 MG tablet Take 2.5 mg by mouth every 6 (six) hours as needed. Disintegrating tablet   [DISCONTINUED] OLANZapine (ZYPREXA) 2.5 MG tablet Take 1 tablet (2.5 mg total) by mouth at bedtime.   No facility-administered encounter medications on file as of 12/29/2023.    Review of Systems  Unable to obtain due to dementia.    Immunization History  Administered Date(s) Administered   Influenza-Unspecified 08/22/2020, 08/20/2021,  08/20/2022, 08/27/2023   Moderna Covid-19 Fall Seasonal Vaccine 26yrs & older 02/11/2023   Moderna Covid-19 Vaccine Bivalent Booster 81yrs & up 11/15/2019, 12/13/2019   Pneumococcal-Unspecified 02/16/2015   Unspecified SARS-COV-2 Vaccination 09/15/2020, 03/23/2021, 07/27/2021, 04/02/2022, 08/01/2023   Pertinent  Health Maintenance Due  Topic Date Due   INFLUENZA VACCINE  Completed   DEXA SCAN  Discontinued      06/12/2023    2:19 PM  Fall Risk  Falls in the past year? 0     Vitals:   12/29/23 1803  BP: 107/64  Pulse: 77  Resp: 16  Temp: (!) 97.3 F (36.3 C)  SpO2: 94%  Weight: 120 lb 12.8 oz (54.8 kg)  Height: 5' 3.5" (1.613 m)   Body mass index is 21.06 kg/m.  Physical Exam Constitutional:      General: She is not in acute distress.    Appearance: Normal appearance.  HENT:     Head: Normocephalic and atraumatic.     Nose: Nose normal.     Mouth/Throat:     Mouth: Mucous membranes are moist.  Eyes:     Conjunctiva/sclera: Conjunctivae normal.  Cardiovascular:     Rate and Rhythm: Normal rate and regular rhythm.  Pulmonary:     Effort: Pulmonary effort is normal.     Breath sounds: Normal breath sounds.  Abdominal:     General: Bowel sounds are normal.     Palpations: Abdomen is soft.  Musculoskeletal:     Cervical back: Normal range of motion.  Skin:    General: Skin is warm and dry.  Psychiatric:     Comments: Occasional screaming noted        Labs reviewed: Recent Labs    03/05/23 0000 03/20/23 0000 09/10/23 0000  NA 141 139 137  K 4.1 4.5 4.2  CL 104 102 102  CO2 30* 28* 29*  BUN 17 30* 19  CREATININE 0.8 0.9 0.6  CALCIUM 8.7 8.8 9.0   Recent Labs    03/05/23 0000 03/20/23 0000 09/10/23 0000  AST  --  14 11*  ALT  --  9 5*  ALKPHOS  --  51  --   ALBUMIN 3.6 3.5 3.9   Recent Labs    03/20/23 0000 09/10/23 0000  WBC 14.6 8.1  NEUTROABS 9,008.00 4,382.00  HGB 12.4 12.8  HCT 38 39  PLT 159 235   No results found for:  "TSH" No results found for: "HGBA1C" Lab Results  Component Value Date   CHOL 178 09/10/2023   HDL 45 09/10/2023   LDLCALC 103 09/10/2023   TRIG 180 (A) 09/10/2023    Significant Diagnostic Results in last 30 days:  No results found.  Assessment/Plan  1. Severe late onset Alzheimer's dementia without behavioral disturbance, psychotic disturbance, mood disturbance, or anxiety (HCC) (Primary) -  will discontinue Zyprexa and start Seroquel -  monitor behavior - QUEtiapine (SEROQUEL) 25 MG tablet; Take 0.5 tablets (12.5 mg total) by mouth at  bedtime. - QUEtiapine (SEROQUEL) 25 MG tablet; Take 0.5 tablets (12.5 mg total) by mouth 2 (two) times daily as needed.     Family/ staff Communication: Discussed plan of care with charge nurse.  Labs/tests ordered:  None    Kenard Gower, DNP, MSN, FNP-BC [facility] [phone] (Monday-Friday 8:00 a.m. - 5:00 p.m.) [phone] (after hours)

## 2023-12-31 ENCOUNTER — Non-Acute Institutional Stay (SKILLED_NURSING_FACILITY): Payer: Self-pay | Admitting: Student

## 2023-12-31 ENCOUNTER — Encounter: Payer: Self-pay | Admitting: Student

## 2023-12-31 DIAGNOSIS — G301 Alzheimer's disease with late onset: Secondary | ICD-10-CM

## 2023-12-31 DIAGNOSIS — F02C0 Dementia in other diseases classified elsewhere, severe, without behavioral disturbance, psychotic disturbance, mood disturbance, and anxiety: Secondary | ICD-10-CM | POA: Diagnosis not present

## 2024-01-03 NOTE — Progress Notes (Unsigned)
 Location:  Other Nursing Home Room Number: [facility] [address] Place of Service:  SNF [phone])  Provider: Sydnee Cabal  PCP: Earnestine Mealing, MD Patient Care Team: Earnestine Mealing, MD as PCP - General (Family Medicine)  Extended Emergency Contact Information Primary Emergency Contact: [facility] Address: [address]          [place], [place] [address] [name] Home Phone: [phone] Mobile Phone: [phone] Relation: Son  Code Status: DNR Goals of care:  Advanced Directive information    12/02/2023    9:33 AM  Advanced Directives  Does Patient Have a Medical Advance Directive? Yes  Type of Estate agent of [place];Out of facility DNR (pink MOST or yellow form);Living will  Does patient want to make changes to medical advance directive? No - Patient declined  Copy of Healthcare Power of Attorney in Chart? Yes - validated most recent copy scanned in chart (See row information)     Allergies  Allergen Reactions   Ambien [Zolpidem]    Gentamicin Other (See Comments)   Hydrocodone    Penicillins    Sulfa Antibiotics Other (See Comments)   Trazodone And Nefazodone    Yellow Dyes (Non-Tartrazine)     Chief Complaint  Patient presents with   Behaviors    Behaviors.     HPI:  89 y.o. female  who has been a resident in longterm care since Mar 16, 2012 was seen today for concern of decline. Patient   History reviewed. No pertinent past medical history.  History reviewed. No pertinent surgical history.    reports that she does not drink alcohol and does not use drugs. No history on file for tobacco use. Social History   Socioeconomic History   Marital status: Widowed    Spouse name: Not on file   Number of children: Not on file   Years of education: Not on file   Highest education level: Not on file  Occupational History   Not on file  Tobacco Use   Smoking status: Unknown   Smokeless tobacco: Not on file  Vaping Use   Vaping  status: Never Used  Substance and Sexual Activity   Alcohol use: Never   Drug use: Never   Sexual activity: Not Currently  Other Topics Concern   Not on file  Social History Narrative   Not on file   Social Drivers of Health   Financial Resource Strain: Not on file  Food Insecurity: Not on file  Transportation Needs: Not on file  Physical Activity: Not on file  Stress: Not on file  Social Connections: Not on file  Intimate Partner Violence: Not on file   Functional Status Survey:    Allergies  Allergen Reactions   Ambien [Zolpidem]    Gentamicin Other (See Comments)   Hydrocodone    Penicillins    Sulfa Antibiotics Other (See Comments)   Trazodone And Nefazodone    Yellow Dyes (Non-Tartrazine)     Pertinent  Health Maintenance Due  Topic Date Due   INFLUENZA VACCINE  Completed   DEXA SCAN  Discontinued    Medications: Outpatient Encounter Medications as of 12/24/2023  Medication Sig   acetaminophen (TYLENOL) 500 MG tablet Take 1,000 mg by mouth 3 (three) times daily.   aluminum-magnesium hydroxide 200-200 MG/5ML suspension Take 10 mLs by mouth every 4 (four) hours as needed for indigestion.   Emollient (AQUAPHOR OINTMENT BODY EX) Apply 1 Application topically every 6 (six) hours as needed (for dry skin/lips).   Ensure (  ENSURE) Take 237 mLs by mouth daily.   guaiFENesin (MUCUS & CHEST CONGESTION) 100 MG/5ML LIQD Take 10 mLs by mouth every 4 (four) hours as needed.   Infant Care Products [facility]) OINT Apply topically as needed. Apply to groin, thigh and buttock every day and evening shift   loratadine (CLARITIN) 10 MG tablet Take 10 mg by mouth daily.   memantine (NAMENDA) 10 MG tablet Take 10 mg by mouth 2 (two) times daily.   Menthol-Methyl Salicylate (BENGAY GREASELESS EX) Apply topically as needed. Apply to both knees   mirtazapine (REMERON) 15 MG tablet Take 15 mg by mouth at bedtime.   nystatin powder Apply 1 Application topically 2 (two) times daily as  needed. Apply to groin for yeast rash   polyethylene glycol (MIRALAX / GLYCOLAX) 17 g packet Take 17 g by mouth every 12 (twelve) hours as needed for mild constipation, moderate constipation or severe constipation.   Protein (PROSOURCE PO) Mix one packet in 8 oz of juice daily.   QUEtiapine (SEROQUEL) 25 MG tablet Take 0.5 tablets (12.5 mg total) by mouth at bedtime.   QUEtiapine (SEROQUEL) 25 MG tablet Take 0.5 tablets (12.5 mg total) by mouth 2 (two) times daily as needed.   sertraline (ZOLOFT) 50 MG tablet Take 50 mg by mouth daily.   Vitamin D, Ergocalciferol, 50000 units CAPS Take 1 capsule by mouth every 30 (thirty) days. Starting on the 2nd and ending on the 2nd every month for SUPPLEMENT   No facility-administered encounter medications on file as of 12/27/2023.    Review of Systems  Vitals:   12/08/2023 0945  BP: 107/64  Pulse: 77  Resp: 16  Temp: (!) 97.3 F (36.3 C)  SpO2: 94%  Weight: 120 lb 12.8 oz (54.8 kg)  Height: 5' 3.5" (1.613 m)   Body mass index is 21.06 kg/m. Physical Exam Constitutional:      Comments: Resting peacefully in bed  Cardiovascular:     Comments: Pulses difficult to palpate, jugular pulsation noted.  Skin:    Comments: Cyanosis noted on bilateral feet, cold to the touch     Labs reviewed: Basic Metabolic Panel: Recent Labs    03/05/23 0000 03/20/23 0000 09/10/23 0000  NA 141 139 137  K 4.1 4.5 4.2  CL 104 102 102  CO2 30* 28* 29*  BUN 17 30* 19  CREATININE 0.8 0.9 0.6  CALCIUM 8.7 8.8 9.0   Liver Function Tests: Recent Labs    03/05/23 0000 03/20/23 0000 09/10/23 0000  AST  --  14 11*  ALT  --  9 5*  ALKPHOS  --  51  --   ALBUMIN 3.6 3.5 3.9   No results for input(s): "LIPASE", "AMYLASE" in the last 8760 hours. No results for input(s): "AMMONIA" in the last 8760 hours. CBC: Recent Labs    03/20/23 0000 09/10/23 0000  WBC 14.6 8.1  NEUTROABS 9,008.00 4,382.00  HGB 12.4 12.8  HCT 38 39  PLT 159 235   Cardiac  Enzymes: No results for input(s): "CKTOTAL", "CKMB", "CKMBINDEX", "TROPONINI" in the last 8760 hours. BNP: Invalid input(s): "POCBNP" CBG: No results for input(s): "GLUCAP" in the last 8760 hours.  Procedures and Imaging Studies During Stay: No results found.  Assessment/Plan:   Dementia Patient with history of dementia. Recent decline without eating or drinking the last 48 hours. Sleeping on initial evaluation. Return at 11:15 and patient is deceased. Most likely cause of death is due to Dementia and failure to thrive during end  of life. Patient will be released to the funeral home of family choice.   Patient is being discharged with the following home health services:    Patient is being discharged with the following durable medical equipment:    Patient has been advised to f/u with their PCP in 1-2 weeks to for a transitions of care visit.  Social services at their facility was responsible for arranging this appointment.  Pt was provided with adequate prescriptions of noncontrolled medications to reach the scheduled appointment .  For controlled substances, a limited supply was provided as appropriate for the individual patient.  If the pt normally receives these medications from a pain clinic or has a contract with another physician, these medications should be received from that clinic or physician only).    Future labs/tests needed:  None   I spent greater than 40 minutes for the care of this patient in face to face time, chart review, clinical documentation, patient education.

## 2024-01-03 DEATH — deceased
